# Patient Record
Sex: Male | Born: 1960 | Race: White | Hispanic: No | Marital: Married | Smoking: Former smoker
Health system: Southern US, Community
[De-identification: ages and names within clinical notes are randomized; demographics above are authoritative.]

## PROBLEM LIST (undated history)

## (undated) DIAGNOSIS — T7840XA Allergy, unspecified, initial encounter: Secondary | ICD-10-CM

## (undated) DIAGNOSIS — Z789 Other specified health status: Secondary | ICD-10-CM

## (undated) HISTORY — DX: Allergy, unspecified, initial encounter: T78.40XA

---

## 2012-03-12 ENCOUNTER — Encounter (HOSPITAL_COMMUNITY): Payer: Self-pay | Admitting: *Deleted

## 2012-03-12 ENCOUNTER — Emergency Department (HOSPITAL_COMMUNITY)
Admission: EM | Admit: 2012-03-12 | Discharge: 2012-03-12 | Disposition: A | Discharge status: Home / Self Care | Payer: BC Managed Care – PPO | Attending: Emergency Medicine | Admitting: Emergency Medicine

## 2012-03-12 DIAGNOSIS — B349 Viral infection, unspecified: Secondary | ICD-10-CM

## 2012-03-12 DIAGNOSIS — Z87891 Personal history of nicotine dependence: Secondary | ICD-10-CM | POA: Insufficient documentation

## 2012-03-12 DIAGNOSIS — J3489 Other specified disorders of nose and nasal sinuses: Secondary | ICD-10-CM | POA: Insufficient documentation

## 2012-03-12 MED ORDER — IPRATROPIUM BROMIDE 0.03 % NA SOLN: 2.0000 | Freq: Two times a day (BID) | NASAL | Status: AC

## 2012-03-12 NOTE — ED Provider Notes (Signed)
History     CSN: [id number]  Arrival date & time 03/12/12  1531   First MD Initiated Contact with Patient 03/12/12 1853      No chief complaint on file.   (Consider location/radiation/quality/duration/timing/severity/associated sxs/prior treatment) The history is provided by the patient.   patient here with nasal congestion and URI symptoms x4 days. No fever, vomiting, diarrhea. No cough or congestion. Some sore throat. Denies any ear pain. Has been using over-the-counter medications without relief. Denies any recent sick exposures. No urinary symptoms  History reviewed. No pertinent past medical history.  History reviewed. No pertinent past surgical history.  No family history on file.  History  Substance Use Topics  . Smoking status: Former Games developer  . Smokeless tobacco: Not on file  . Alcohol Use: Yes     occ      Review of Systems  All other systems reviewed and are negative.    Allergies  Review of patient's allergies indicates no known allergies.  Home Medications   Current Outpatient Rx  Name Route Sig Dispense Refill  . IBUPROFEN 200 MG PO TABS Oral Take 400 mg by mouth every 6 (six) hours as needed. For pain    . ADULT MULTIVITAMIN W/MINERALS CH Oral Take 1 tablet by mouth daily.      BP 113/72  Pulse 90  Temp 98.7 F (37.1 C)  Resp 20  SpO2 98%  Physical Exam  Nursing note and vitals reviewed. Constitutional: He is oriented to person, place, and time. He appears well-developed and well-nourished.  Non-toxic appearance.  HENT:  Head: Normocephalic and atraumatic.  Eyes: Conjunctivae are normal. Pupils are equal, round, and reactive to light.  Neck: Normal range of motion.  Cardiovascular: Normal rate.   Pulmonary/Chest: Effort normal.  Neurological: He is alert and oriented to person, place, and time.  Skin: Skin is warm and dry.  Psychiatric: His mood appears anxious.    ED Course  Procedures (including critical care time)  Labs Reviewed  - No data to display No results found.   No diagnosis found.    MDM  Suspect that patient has viral illness. Will place patient on Atrovent nasal drops and will give PCP referral        Toy Baker, MD 03/12/12 [phone]

## 2012-03-12 NOTE — Discharge Instructions (Signed)
Antibiotic Nonuse  Your caregiver felt that the infection or problem was not one that would be helped with an antibiotic. Infections may be caused by viruses or bacteria. Only a caregiver can tell which one of these is the likely cause of an illness. A cold is the most common cause of infection in both adults and children. A cold is a virus. Antibiotic treatment will have no effect on a viral infection. Viruses can lead to many lost days of work caring for sick children and many missed days of school. Children may catch as many as 10 "colds" or "flus" per year during which they can be tearful, cranky, and uncomfortable. The goal of treating a virus is aimed at keeping the ill person comfortable. Antibiotics are medications used to help the body fight bacterial infections. There are relatively few types of bacteria that cause infections but there are hundreds of viruses. While both viruses and bacteria cause infection they are very different types of germs. A viral infection will typically go away by itself within 7 to 10 days. Bacterial infections may spread or get worse without antibiotic treatment. Examples of bacterial infections are:  Sore throats (like strep throat or tonsillitis).   Infection in the lung (pneumonia).   Ear and skin infections.  Examples of viral infections are:  Colds or flus.   Most coughs and bronchitis.   Sore throats not caused by Strep.   Runny noses.  It is often best not to take an antibiotic when a viral infection is the cause of the problem. Antibiotics can kill off the helpful bacteria that we have inside our body and allow harmful bacteria to start growing. Antibiotics can cause side effects such as allergies, nausea, and diarrhea without helping to improve the symptoms of the viral infection. Additionally, repeated uses of antibiotics can cause bacteria inside of our body to become resistant. That resistance can be passed onto harmful bacterial. The next time  you have an infection it may be harder to treat if antibiotics are used when they are not needed. Not treating with antibiotics allows our own immune system to develop and take care of infections more efficiently. Also, antibiotics will work better for us when they are prescribed for bacterial infections. Treatments for a child that is ill may include:  Give extra fluids throughout the day to stay hydrated.   Get plenty of rest.   Only give your child over-the-counter or prescription medicines for pain, discomfort, or fever as directed by your caregiver.   The use of a cool mist humidifier may help stuffy noses.   Cold medications if suggested by your caregiver.  Your caregiver may decide to start you on an antibiotic if:  The problem you were seen for today continues for a longer length of time than expected.   You develop a secondary bacterial infection.  SEEK MEDICAL CARE IF:  Fever lasts longer than 5 days.   Symptoms continue to get worse after 5 to 7 days or become severe.   Difficulty in breathing develops.   Signs of dehydration develop (poor drinking, rare urinating, dark colored urine).   Changes in behavior or worsening tiredness (listlessness or lethargy).  Document Released: 02/11/2002 Document Revised: 11/22/2011 Document Reviewed: 08/10/2009 ExitCare Patient Information 2012 [employer].Viral Infections A viral infection can be caused by different types of viruses.Most viral infections are not serious and resolve on their own. However, some infections may cause severe symptoms and may lead to further complications. SYMPTOMS Viruses   can frequently cause:  Minor sore throat.   Aches and pains.   Headaches.   Runny nose.   Different types of rashes.   Watery eyes.   Tiredness.   Cough.   Loss of appetite.   Gastrointestinal infections, resulting in nausea, vomiting, and diarrhea.  These symptoms do not respond to antibiotics because the infection  is not caused by bacteria. However, you might catch a bacterial infection following the viral infection. This is sometimes called a "superinfection." Symptoms of such a bacterial infection may include:  Worsening sore throat with pus and difficulty swallowing.   Swollen neck glands.   Chills and a high or persistent fever.   Severe headache.   Tenderness over the sinuses.   Persistent overall ill feeling (malaise), muscle aches, and tiredness (fatigue).   Persistent cough.   Yellow, green, or brown mucus production with coughing.  HOME CARE INSTRUCTIONS   Only take over-the-counter or prescription medicines for pain, discomfort, diarrhea, or fever as directed by your caregiver.   Drink enough water and fluids to keep your urine clear or pale yellow. Sports drinks can provide valuable electrolytes, sugars, and hydration.   Get plenty of rest and maintain proper nutrition. Soups and broths with crackers or rice are fine.  SEEK IMMEDIATE MEDICAL CARE IF:   You have severe headaches, shortness of breath, chest pain, neck pain, or an unusual rash.   You have uncontrolled vomiting, diarrhea, or you are unable to keep down fluids.   You or your child has an oral temperature above 102 F (38.9 C), not controlled by medicine.   Your baby is older than 3 months with a rectal temperature of 102 F (38.9 C) or higher.   Your baby is 3 months old or younger with a rectal temperature of 100.4 F (38 C) or higher.  MAKE SURE YOU:   Understand these instructions.   Will watch your condition.   Will get help right away if you are not doing well or get worse.  Document Released: 09/12/2005 Document Revised: 11/22/2011 Document Reviewed: 04/09/2011 ExitCare Patient Information 2012 [employer]. 

## 2012-03-12 NOTE — ED Notes (Signed)
Pt is here with symptoms of nasal congestion and headaches on Sunday.  Pt sts he has been taking otc medications.  Pt is having hot and cold symptoms at the same time.  Sore throat.  No vomiting or diarrhea

## 2012-03-12 NOTE — ED Notes (Signed)
Pt was called to come to str. Triage 4 with no answer.

## 2013-04-07 ENCOUNTER — Ambulatory Visit (INDEPENDENT_AMBULATORY_CARE_PROVIDER_SITE_OTHER): Payer: PRIVATE HEALTH INSURANCE | Admitting: Family Medicine

## 2013-04-07 VITALS — BP 119/73 | HR 78 | Temp 98.5°F | Resp 16 | Ht 73.0 in | Wt 168.0 lb

## 2013-04-07 DIAGNOSIS — M79609 Pain in unspecified limb: Secondary | ICD-10-CM

## 2013-04-07 DIAGNOSIS — L02419 Cutaneous abscess of limb, unspecified: Secondary | ICD-10-CM

## 2013-04-07 MED ORDER — DOXYCYCLINE HYCLATE 100 MG PO CAPS: 100.0000 mg | ORAL_CAPSULE | Freq: Two times a day (BID) | ORAL | Status: DC

## 2013-04-07 NOTE — Progress Notes (Signed)
  Subjective:    Patient ID: Ivan Blackwell, male    DOB: 1961-07-20, 52 y.o.   MRN: [id number] Chief Complaint  Patient presents with  . Insect Bite    right leg x 2 day    HPI  2d noted a little pimple and over the past 2d progressively red and big.  Not draining anything. No insect seen. Nothing like this prior.  History reviewed. No pertinent past medical history. Current Outpatient Prescriptions on File Prior to Visit  Medication Sig Dispense Refill  . ibuprofen (ADVIL,MOTRIN) 200 MG tablet Take 400 mg by mouth every 6 (six) hours as needed. For pain      . Multiple Vitamin (MULITIVITAMIN WITH MINERALS) TABS Take 1 tablet by mouth daily.       No current facility-administered medications on file prior to visit.   No Known Allergies  Review of Systems  Constitutional: Negative for fever, chills, diaphoresis, activity change, appetite change, fatigue and unexpected weight change.  Cardiovascular: Negative for leg swelling.  Gastrointestinal: Negative for nausea, vomiting, abdominal pain, diarrhea and constipation.  Musculoskeletal: Positive for myalgias. Negative for back pain, joint swelling, arthralgias and gait problem.  Skin: Positive for rash and wound.  Neurological: Negative for weakness and numbness.  Hematological: Negative for adenopathy. Does not bruise/bleed easily.      BP 119/73  Pulse 78  Temp(Src) 98.5 F (36.9 C) (Oral)  Resp 16  Ht 6\' 1"  (1.854 m)  Wt 168 lb (76.204 kg)  BMI 22.17 kg/m2  SpO2 98% Objective:   Physical Exam  Constitutional: He is oriented to person, place, and time. He appears well-developed and well-nourished. No distress.  HENT:  Head: Normocephalic and atraumatic.  Eyes: No scleral icterus.  Pulmonary/Chest: Effort normal.  Musculoskeletal:       Right hip: Normal. He exhibits normal range of motion and normal strength.       Left hip: Normal. He exhibits normal range of motion and normal strength.  Neurological: He is alert and  oriented to person, place, and time.  Skin: Skin is warm and dry. Rash noted. Rash is maculopapular. He is not diaphoretic.  Left hip, immed below lateral greater trochanter is approx 5cm dm tender erythema, mild warmth with central pinpoint eschar drains small of purulent discharge with palpation.  Small 2cm area of central fluctuance palpable beneath center.  Psychiatric: He has a normal mood and affect. His behavior is normal.      Assessment & Plan:  Cellulitis and abscess of leg, except foot - Plan: Wound culture - s/p I&D w/ packing by Debbra Riding in clinic. Start doxy. F/u in 2d for recheck.  Meds ordered this encounter  Medications  . doxycycline (VIBRAMYCIN) 100 MG capsule    Sig: Take 1 capsule (100 mg total) by mouth 2 (two) times daily.    Dispense:  20 capsule    Refill:  0

## 2013-04-07 NOTE — Progress Notes (Signed)
Procedure Note: Verbal consent obtained.  Local anesthesia with 3 cc 2% lidocaine.  Betadine prep.  Incision with 11 blade.  Very small amount of purulence expressed.  Wound irrigated with remaining anesthetic.  Packed with 1/4 inch plain packing.  Cleansed and dressed.  Discussed wound care.

## 2013-04-10 LAB — WOUND CULTURE: Gram Stain: NONE SEEN

## 2013-06-10 ENCOUNTER — Ambulatory Visit (INDEPENDENT_AMBULATORY_CARE_PROVIDER_SITE_OTHER): Payer: PRIVATE HEALTH INSURANCE | Admitting: Emergency Medicine

## 2013-06-10 ENCOUNTER — Ambulatory Visit: Payer: PRIVATE HEALTH INSURANCE

## 2013-06-10 VITALS — BP 117/87 | HR 67 | Temp 98.0°F | Resp 17 | Ht 74.0 in | Wt 168.0 lb

## 2013-06-10 DIAGNOSIS — M549 Dorsalgia, unspecified: Secondary | ICD-10-CM

## 2013-06-10 DIAGNOSIS — R51 Headache: Secondary | ICD-10-CM

## 2013-06-10 MED ORDER — CYCLOBENZAPRINE HCL 5 MG PO TABS: ORAL_TABLET | ORAL | Status: DC

## 2013-06-10 MED ORDER — MELOXICAM 7.5 MG PO TABS: 7.5000 mg | ORAL_TABLET | Freq: Every day | ORAL | Status: DC

## 2013-06-10 NOTE — Patient Instructions (Addendum)
Please call if you continue to have problems and I will make a referral either to physical therapy or chiropractic careCervical Sprain A cervical sprain is when the ligaments in the neck stretch or tear. The ligaments are the tissues that hold the neck bones in place. HOME CARE   Put ice on the injured area.  Put ice in a plastic bag.  Place a towel between your skin and the bag.  Leave the ice on for 15-20 minutes, 3-4 times a day.  Only take medicine as told by your doctor.  Keep all doctor visits as told.  Keep all physical therapy visits as told.  If your doctor gives you a neck collar, wear it as told.  Do not drive while wearing a neck collar.  Adjust your work station so that you have good posture while you work.  Avoid positions and activities that make your problems worse.  Warm up and stretch before being active. GET HELP RIGHT AWAY IF:   You are bleeding or your stomach is upset.  You have an allergic reaction to your medicine.  Your problems (symptoms) get worse.  You develop new problems.  You lose feeling (numbness) or you cannot move (paralysis) any part of your body.  You have tingling or weakness in any part of your body.  Your pain is not controlled with medicine.  You cannot take less pain medicine over time as planned.  Your activity level does not improve as expected. MAKE SURE YOU:   Understand these instructions.  Will watch your condition.  Will get help right away if you are not doing well or get worse. Document Released: 05/21/2008 Document Revised: 02/25/2012 Document Reviewed: 09/06/2011 [facility] Patient Information 2014 [place], [place].

## 2013-06-10 NOTE — Progress Notes (Signed)
  Subjective:    Patient ID: Ivan Blackwell, male    DOB: [birth year], 52 y.o.   MRN: [id number]  HPI Pt complains of pain/spasm in his right upper back starting 3-4 days ago. Says he feels like it feels like it comes down his arm. He has no history of neck injury. 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 He has tried some pain relievers with no relief. SOmetimes he wakes up in the middle of the night and feels it throbbing.     Review of Systems     Objective:   Physical Exam there is tenderness along the right side and C-spine. There is tenderness along the right scapula and in the triceps area. Deep tendon reflexes are 2+ and symmetrical. The patient sits with his head tipped to the left. He does have pain when he extends his neck. [facility] reading (PRIMARY) by  Dr.Janay Canan there is C5-6 C6-7 degenerative disc disease          Assessment & Plan:  Patient has evidence of C5-6 C6-7 degenerative disc disease. I have recommended chiropractic care for his problem. I think he would do well with cervical traction and chiropractic modalities. Patient would prefer to try medication first. We'll try Mobic and Flexeril. If he continues to have problems then we'll make referral to either chiropractic care or physical therapy.

## 2013-07-07 ENCOUNTER — Ambulatory Visit: Payer: PRIVATE HEALTH INSURANCE

## 2013-07-07 ENCOUNTER — Ambulatory Visit (INDEPENDENT_AMBULATORY_CARE_PROVIDER_SITE_OTHER): Payer: PRIVATE HEALTH INSURANCE | Admitting: Family Medicine

## 2013-07-07 VITALS — BP 124/80 | HR 58 | Temp 97.9°F | Resp 16 | Ht 74.0 in | Wt 170.8 lb

## 2013-07-07 DIAGNOSIS — M25511 Pain in right shoulder: Secondary | ICD-10-CM

## 2013-07-07 DIAGNOSIS — M25519 Pain in unspecified shoulder: Secondary | ICD-10-CM

## 2013-07-07 MED ORDER — TRAMADOL HCL 50 MG PO TABS: 50.0000 mg | ORAL_TABLET | Freq: Three times a day (TID) | ORAL | Status: DC | PRN

## 2013-07-07 NOTE — Progress Notes (Signed)
[facility] [address], [place] [address] [phone]  Date:  07/07/2013   Name:  Ivan Blackwell   DOB:  1961/06/25   MRN:  [id number]  PCP:  Pcp Not In System    Chief Complaint: Shoulder Pain and Neck Pain   History of Present Illness:  Ivan Blackwell is a 52 y.o. very pleasant male patient who presents with the following:  Here last month with pain in his right upper back, dx with cervical degenerative changes.  tx with mobic and flexeril.    Films from 06/10/13: CERVICAL SPINE - 2-3 VIEW  Comparison: None.  Findings: Two-view exam shows no fracture. No subluxation. Loss  of disc height is seen at C5-6 and C6-7. The no prevertebral soft  tissue swelling. Straightening of normal cervical lordosis is  evident.  IMPRESSION:  Loss of disc height at C5-6 and C6-7 consistent with degenerative  change.  Clinically significant discrepancy from primary report, if  provided: None  RIGHT SHOULDER - 2+ VIEW  Comparison: None.  Findings: Three-view study shows no fracture. No subluxation or  dislocation. No worrisome lytic or sclerotic osseous abnormality.  IMPRESSION:  No findings to explain the patient's history of pain.  He states that his neck pain seemed to resolve, but then came back.  He now feels as though his right shoulder ROM has decreased and he is having trouble with dressing/ ADLs.   This morning he notes "sharp pain all up and down my right shoulder and down into my right arm."  There was no acute injury that he can recall.  He notes that the flexeril and mobic "are very slow working medications.  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  Estimates that it weighs about 35 lbs.    There are no active problems to display for this patient.   History reviewed. No pertinent past medical history.  History reviewed. No pertinent past surgical history.  History   Substance Use Topics  . Smoking status: Former Games developer  . Smokeless tobacco: Not on file  . Alcohol Use: Yes     Comment: occ    History reviewed. No pertinent family history.  No Known Allergies  Medication list has been reviewed and updated.  Current Outpatient Prescriptions on File Prior to Visit  Medication Sig Dispense Refill  . cyclobenzaprine (FLEXERIL) 5 MG tablet Take one tablet at night as a muscle relaxant  30 tablet  0  . meloxicam (MOBIC) 7.5 MG tablet Take 1 tablet (7.5 mg total) by mouth daily.  14 tablet  0   No current facility-administered medications on file prior to visit.    Review of Systems:  As per HPI- otherwise negative.   Physical Examination: Filed Vitals:   07/07/13 0752  BP: 124/80  Pulse: 58  Temp: 97.9 F (36.6 C)  Resp: 16   Filed Vitals:   07/07/13 0752  Height: 6\' 2"  (1.88 m)  Weight: 170 lb 12.8 oz (77.474 kg)   Body mass index is 21.92 kg/(m^2). Ideal Body Weight: Weight in (lb) to have BMI = 25: 194.3  GEN: WDWN, NAD, Non-toxic, Alert but unusual affect.  Avoids eye contact and seems quite irrititable. He is carrying a large knife on his belt HEENT: Atraumatic, Normocephalic. Neck supple. No masses, No LAD. Ears and Nose: No external deformity. CV: RRR, No M/G/R. No JVD.  No thrill. No extra heart sounds. PULM: CTA B, no wheezes, crackles, rhonchi. No retractions. No resp. distress. No accessory muscle use. EXTR: No c/c/e NEURO Normal gait.  PSYCH: Normally interactive. Conversant. Not depressed or anxious appearing.  Calm demeanor.  Right shoulder: tender over the right anterior rotator cuff tendon insertion.  Normal ROM, no evidence of any dislocation, cellulitis or skin lesion.  Negative empty can test.    [facility] reading (PRIMARY) by  Dr. Patsy Lager. Right shoulder: normal RIGHT SHOULDER - 2+ VIEW  Comparison: 06/10/2013.  Findings: Alignment is normal. Joint spaces are preserved. No fracture or dislocation is evident. No  soft tissue lesions are seen. No calcific bursitis or calcific tendonitis is evident. No cervical rib is evident.  IMPRESSION: No shoulder abnormality is identified.  Clinically significant discrepancy from primary report, if provided: None  Assessment and Plan: Pain in joint, shoulder region, right - Plan: DG Shoulder Right, traMADol (ULTRAM) 50 MG tablet  Shoulder pain.  Suspect her has rotator cuff tendonitis.  He may try tramadol for pain.  He wants to know precisely why his shoulder hurts.  Advised him that an MRI is necessary to visualize the soft tissue of his shoulder.  He does not want an MRI at this time, but will let me know if not better and I can refer him to ortho or order an MRI if he would like.    Signed Abbe Amsterdam, MD

## 2013-07-07 NOTE — Patient Instructions (Addendum)
We are going to try using tramadol for the pain in your shoulder.  If you are not better in the next few days please let me know.  In that case I can send you to a shoulder specialist an injection may be helpful. In the meantime try to keep up your shoulder range of motion by doing gentle stretches.

## 2013-07-15 ENCOUNTER — Telehealth: Payer: Self-pay

## 2013-07-15 NOTE — Telephone Encounter (Signed)
Pt called and spoke with [facility]. He reported that he was here on 6/25 to see Dr Cleta Alberts re: cervical sprain and took the flexeril and mobic Rxd, but Sxs worsened. Pt RTC and saw Dr Patsy Lager who recommended ortho or MRI since pain had moved into the shoulder/arm. Also Rxd Ultram. Pain is not longer in neck now, but is still in R shoulder/arm and back. Pt reports that he also has a new Sx that he only wants to talk with a male MD about. Pt has also tried ice/hot soaks and icy hot. Pt advised Jill Side that he had refused the chiro, ortho and MRI d/t cost.  LMOM for pt to CB.

## 2013-07-15 NOTE — Telephone Encounter (Signed)
I spoke w/Dr Cleta Alberts who reviewed pt's chart and xray. Dr Cleta Alberts called pt d/t pt's req to speak to a male MD only about new Sx. Dr Cleta Alberts had to [facility] advising pt what xray showed and that next step if no improvement w/medication would be to either refer to specialist or order MRI. Asked for CB.

## 2013-07-16 ENCOUNTER — Telehealth: Payer: Self-pay

## 2013-07-16 ENCOUNTER — Ambulatory Visit (INDEPENDENT_AMBULATORY_CARE_PROVIDER_SITE_OTHER): Payer: PRIVATE HEALTH INSURANCE | Admitting: Emergency Medicine

## 2013-07-16 VITALS — BP 130/94 | HR 74 | Temp 97.5°F | Resp 18 | Ht 74.0 in | Wt 163.0 lb

## 2013-07-16 DIAGNOSIS — M542 Cervicalgia: Secondary | ICD-10-CM

## 2013-07-16 DIAGNOSIS — R202 Paresthesia of skin: Secondary | ICD-10-CM

## 2013-07-16 DIAGNOSIS — R937 Abnormal findings on diagnostic imaging of other parts of musculoskeletal system: Secondary | ICD-10-CM

## 2013-07-16 DIAGNOSIS — R200 Anesthesia of skin: Secondary | ICD-10-CM

## 2013-07-16 DIAGNOSIS — IMO0002 Reserved for concepts with insufficient information to code with codable children: Secondary | ICD-10-CM

## 2013-07-16 DIAGNOSIS — R209 Unspecified disturbances of skin sensation: Secondary | ICD-10-CM

## 2013-07-16 MED ORDER — OXYCODONE-ACETAMINOPHEN 5-325 MG PO TABS: 1.0000 | ORAL_TABLET | Freq: Three times a day (TID) | ORAL | Status: DC | PRN

## 2013-07-16 MED ORDER — PREDNISONE 20 MG PO TABS: ORAL_TABLET | ORAL | Status: DC

## 2013-07-16 NOTE — Telephone Encounter (Signed)
Pt called very upset about his recent visits and treatment plan. States he feels that his problem shouldn't be as bad as it is still. Talked to pt for almost 20 minutes. Pt upset about his back pain and requested copy of the xrays of his back/head/spine so he can show people at work so they understand his pain. Pt has DDD. Pt felt that he has come to see [place] so often recently and paid more in copays than he felt necessary we should provide this service for him. I stated we were more than happy to do that for him.  Pt continued to speak about his anxiety over possible back surgery and his fears about that. Pt has an MRI tomorrow morning. Pt asked for my advice on the possibility of surgery and I assured him that the dr would make the best decisions regarding his medical care and if need be refer pt to specialist based on those results and that its a case by case basis.   I gave Corrie Dandy B the pt's information to burn xrays to cd.   Pt seemed like he just needed to talk for a while. Pt seemed more reassured when the call ended. Will pick up xray cd this evening.   bf

## 2013-07-16 NOTE — Patient Instructions (Addendum)
Report to [facility] at [facility] at 8 am tomorrow morning for your MRI.

## 2013-07-16 NOTE — Progress Notes (Signed)
  Subjective:    Patient ID: Ivan Blackwell, male    DOB: [birth year], 52 y.o.   MRN: [id number]  HPI patient has persistent pain in his neck he was found to have cervical disc disease at C5-6 and C6-7. He has excruciating pain in his shoulder and down his right arm. He does not feel weak in that arm. He just is unable to get in a comfortable position. He has had difficulty getting erections but has had no symptoms of bowel or bladder incontinence    Review of Systems     Objective:   Physical Exam patient pacing in the room appears uncomfortable regarding his neck pain. Deep tendon reflexes of the upper extremities are 2+. Motor strength was symmetrical.        Assessment & Plan:  Plan on emergent MRI tomorrow. We'll place patent stent on prednisone in a taper dose Percocet for pain.

## 2013-07-16 NOTE — Telephone Encounter (Signed)
Pt was seen today in office. 

## 2013-07-17 ENCOUNTER — Ambulatory Visit
Admission: RE | Admit: 2013-07-17 | Discharge: 2013-07-17 | Disposition: A | Discharge status: Home / Self Care | Payer: PRIVATE HEALTH INSURANCE | Source: Ambulatory Visit | Attending: Emergency Medicine | Admitting: Emergency Medicine

## 2013-07-17 ENCOUNTER — Other Ambulatory Visit: Payer: Self-pay | Admitting: Emergency Medicine

## 2013-07-17 ENCOUNTER — Other Ambulatory Visit: Payer: BC Managed Care – PPO

## 2013-07-17 DIAGNOSIS — M5010 Cervical disc disorder with radiculopathy, unspecified cervical region: Secondary | ICD-10-CM

## 2013-07-17 DIAGNOSIS — R202 Paresthesia of skin: Secondary | ICD-10-CM

## 2013-07-17 DIAGNOSIS — R937 Abnormal findings on diagnostic imaging of other parts of musculoskeletal system: Secondary | ICD-10-CM

## 2013-07-17 DIAGNOSIS — R200 Anesthesia of skin: Secondary | ICD-10-CM

## 2013-07-17 DIAGNOSIS — M542 Cervicalgia: Secondary | ICD-10-CM

## 2013-07-17 DIAGNOSIS — IMO0002 Reserved for concepts with insufficient information to code with codable children: Secondary | ICD-10-CM

## 2013-07-17 NOTE — Telephone Encounter (Signed)
Thanks so much, will call when MRI results in.

## 2013-07-18 ENCOUNTER — Telehealth: Payer: Self-pay

## 2013-07-18 ENCOUNTER — Other Ambulatory Visit: Payer: Self-pay | Admitting: Emergency Medicine

## 2013-07-18 DIAGNOSIS — IMO0002 Reserved for concepts with insufficient information to code with codable children: Secondary | ICD-10-CM

## 2013-07-18 MED ORDER — OXYCODONE-ACETAMINOPHEN 5-325 MG PO TABS: 1.0000 | ORAL_TABLET | ORAL | Status: DC | PRN

## 2013-07-18 NOTE — Telephone Encounter (Signed)
Patient would like someone to call him about questions with his pain medication please call him at [phone]

## 2013-07-18 NOTE — Telephone Encounter (Signed)
Patient came in office this am. He was put on oxycodone 5/325 q 8 hrs prn. He is not getting relief from pain in the am. The pain is worst in the am. He has tried taking with little food lot of food to see if that helped medication absorb better or get any more relief but he is not getting relief with first dose in the am. Since it does not resolved pain in am he feels the need to take before 8 hrs. He has not done this but he is wanting to know what can he do to get pain relief in am. He also is afraid that once he runs out of medication that's it and he will be left out in the cold until you figure out next plan or waiting for next plan. Please advise

## 2013-07-18 NOTE — Telephone Encounter (Signed)
Pt calling very upset/rude because we have not prescribed him medication that have worked, he states that we have changed his medications 3 times and have not helped him relieve pain. Patient wants answers as to why we are unable to help him with his pain. Best# [phone]

## 2013-07-18 NOTE — Telephone Encounter (Signed)
I called and discussed the situation with the patient. He is going to take his medication every 4 hours. We are calling on Monday to see when we can get him into neurosurgery.

## 2013-07-20 ENCOUNTER — Other Ambulatory Visit: Payer: BC Managed Care – PPO

## 2013-07-21 ENCOUNTER — Other Ambulatory Visit: Payer: BC Managed Care – PPO

## 2013-07-21 ENCOUNTER — Telehealth: Payer: Self-pay

## 2013-07-21 NOTE — Telephone Encounter (Signed)
Patient would like for a nurse to call him regarding his medications and dosages please call him at [phone]

## 2013-07-22 NOTE — Telephone Encounter (Signed)
  Disp Refills Start End predniSONE (DELTASONE) 20 MG tablet 18 tablet 0 07/16/2013 Take 3 a day for 3 days 2 a day for 3 days one a day for 3 days oxyCODONE-acetaminophen (ROXICET) 5-325 MG per tablet (Discontinued) 20 tablet

## 2013-07-22 NOTE — Telephone Encounter (Signed)
Called patient. He has taken Oxycodone q4hrs since q8hrs did not help him. He vomited from this. He indicates he was unsure if he should take q 8hrs or q 4hrs advised to take this q 8hrs, only as needed. Patient is going to have surgery for his neck because of his triceps weakness. Dr Franky Macho will do the surgery. To you FYI

## 2013-07-23 ENCOUNTER — Other Ambulatory Visit: Payer: Self-pay | Admitting: Neurosurgery

## 2013-07-24 ENCOUNTER — Encounter (HOSPITAL_COMMUNITY): Payer: Self-pay

## 2013-07-24 ENCOUNTER — Encounter (HOSPITAL_COMMUNITY)
Admission: RE | Admit: 2013-07-24 | Discharge: 2013-07-24 | Disposition: A | Discharge status: Home / Self Care | Payer: PRIVATE HEALTH INSURANCE | Source: Ambulatory Visit | Attending: Neurosurgery | Admitting: Neurosurgery

## 2013-07-24 DIAGNOSIS — Z01818 Encounter for other preprocedural examination: Secondary | ICD-10-CM | POA: Insufficient documentation

## 2013-07-24 DIAGNOSIS — Z01812 Encounter for preprocedural laboratory examination: Secondary | ICD-10-CM | POA: Insufficient documentation

## 2013-07-24 HISTORY — DX: Other specified health status: Z78.9

## 2013-07-24 LAB — SURGICAL PCR SCREEN: Staphylococcus aureus: NEGATIVE

## 2013-07-24 LAB — CBC
HCT: 38.3 % — ABNORMAL LOW (ref 39.0–52.0)
Hemoglobin: 13.8 g/dL (ref 13.0–17.0)
MCH: 31 pg (ref 26.0–34.0)
MCHC: 36 g/dL (ref 30.0–36.0)
MCV: 86.1 fL (ref 78.0–100.0)
RDW: 12.4 % (ref 11.5–15.5)

## 2013-07-24 NOTE — Pre-Procedure Instructions (Signed)
Ivan Blackwell  07/24/2013   Your procedure is scheduled on:  Wednmesday, August 13th.  Report to [facility] at 8:00AM.  Call this number if you have problems the morning of surgery: [phone]   Remember:   Do not eat food or drink liquids after midnight.   Take these medicines the morning of surgery with A SIP OF WATER: oxyCODONE-acetaminophen (ROXICET) or traMADol (ULTRAM).  Stop taking Aspirin, Coumadin, Plavix, Effient and Herbal medications.  Do not take any NSAIDs WU:[id number] (MOBIC),  Ibuprofen,  Advil,Naproxen or any medication containing Aspirin.    Do not wear jewelry, make-up or nail polish.  Do not wear lotions, powders, or perfumes. You may wear deodorant.   Men may shave face and neck.  Do not bring valuables to the hospital.  [facility] is not responsible  for any belongings or valuables.  Contacts, dentures or bridgework may not be worn into surgery.  Leave suitcase in the car. After surgery it may be brought to your room.  For patients admitted to the hospital, checkout time is 11:00 AM the day of discharge.   Patients discharged the day of surgery will not be allowed to drive home.  Name and phone number of your driver: -   Special Instructions: Shower using CHG 2 nights before surgery and the night before surgery.  If you shower the day of surgery use CHG.  Use special wash - you have one bottle of CHG for all showers.  You should use approximately 1/3 of the bottle for each shower.   Please read over the following fact sheets that you were given: Pain Booklet, Coughing and Deep Breathing and Surgical Site Infection Prevention

## 2013-07-24 NOTE — Progress Notes (Signed)
Pt informed that if he goes home that someone must be with him for the first 24 hours.Pt said he would have his [place] to  check on him.  I repeated again that he must have someone to stay with him.  Pt said that he can always do what he chooses to do.

## 2013-07-28 MED ORDER — CEFAZOLIN SODIUM-DEXTROSE 2-3 GM-% IV SOLR
2.0000 g | INTRAVENOUS | Status: AC
  Administered 2013-07-29: 2 g via INTRAVENOUS
  Filled 2013-07-28: qty 50

## 2013-07-29 ENCOUNTER — Ambulatory Visit (HOSPITAL_COMMUNITY)
Admission: RE | Admit: 2013-07-29 | Discharge: 2013-07-30 | Disposition: A | Discharge status: Home / Self Care | Payer: PRIVATE HEALTH INSURANCE | Source: Ambulatory Visit | Attending: Neurosurgery | Admitting: Neurosurgery

## 2013-07-29 ENCOUNTER — Ambulatory Visit (HOSPITAL_COMMUNITY): Payer: PRIVATE HEALTH INSURANCE

## 2013-07-29 ENCOUNTER — Encounter (HOSPITAL_COMMUNITY): Payer: Self-pay | Admitting: *Deleted

## 2013-07-29 ENCOUNTER — Encounter (HOSPITAL_COMMUNITY): Payer: Self-pay | Admitting: Certified Registered Nurse Anesthetist

## 2013-07-29 ENCOUNTER — Ambulatory Visit (HOSPITAL_COMMUNITY): Payer: PRIVATE HEALTH INSURANCE | Admitting: Certified Registered Nurse Anesthetist

## 2013-07-29 ENCOUNTER — Encounter (HOSPITAL_COMMUNITY)
Admission: RE | Disposition: A | Discharge status: Home / Self Care | Payer: Self-pay | Source: Ambulatory Visit | Attending: Neurosurgery

## 2013-07-29 DIAGNOSIS — M5020 Other cervical disc displacement, unspecified cervical region: Secondary | ICD-10-CM | POA: Insufficient documentation

## 2013-07-29 DIAGNOSIS — M47812 Spondylosis without myelopathy or radiculopathy, cervical region: Secondary | ICD-10-CM | POA: Insufficient documentation

## 2013-07-29 HISTORY — PX: ANTERIOR CERVICAL DECOMP/DISCECTOMY FUSION: SHX1161

## 2013-07-29 SURGERY — ANTERIOR CERVICAL DECOMPRESSION/DISCECTOMY FUSION 1 LEVEL
Anesthesia: General | Wound class: Clean

## 2013-07-29 MED ORDER — LIDOCAINE HCL 4 % MT SOLN
OROMUCOSAL | Status: DC | PRN
  Administered 2013-07-29: 80 mL via TOPICAL
  Administered 2013-07-29: 4 mL via TOPICAL

## 2013-07-29 MED ORDER — HYDROMORPHONE HCL PF 1 MG/ML IJ SOLN
INTRAMUSCULAR | Status: DC | PRN
  Administered 2013-07-29: 1 mg via INTRAVENOUS

## 2013-07-29 MED ORDER — PROMETHAZINE HCL 25 MG/ML IJ SOLN: 6.2500 mg | INTRAMUSCULAR | Status: DC | PRN

## 2013-07-29 MED ORDER — ROCURONIUM BROMIDE 100 MG/10ML IV SOLN
INTRAVENOUS | Status: DC | PRN
  Administered 2013-07-29: 50 mg via INTRAVENOUS

## 2013-07-29 MED ORDER — POLYETHYLENE GLYCOL 3350 17 G PO PACK
17.0000 g | PACK | Freq: Every day | ORAL | Status: DC | PRN
  Filled 2013-07-29: qty 1

## 2013-07-29 MED ORDER — LACTATED RINGERS IV SOLN
INTRAVENOUS | Status: DC
  Administered 2013-07-29 (×2): via INTRAVENOUS

## 2013-07-29 MED ORDER — PHENOL 1.4 % MT LIQD: 1.0000 | OROMUCOSAL | Status: DC | PRN

## 2013-07-29 MED ORDER — NEOSTIGMINE METHYLSULFATE 1 MG/ML IJ SOLN
INTRAMUSCULAR | Status: DC | PRN
  Administered 2013-07-29: 4 mg via INTRAVENOUS

## 2013-07-29 MED ORDER — OXYCODONE HCL 5 MG PO TABS
ORAL_TABLET | ORAL | Status: AC
  Administered 2013-07-29: 5 mg
  Filled 2013-07-29: qty 1

## 2013-07-29 MED ORDER — FENTANYL CITRATE 0.05 MG/ML IJ SOLN
INTRAMUSCULAR | Status: DC | PRN
  Administered 2013-07-29: 50 ug via INTRAVENOUS
  Administered 2013-07-29: 150 ug via INTRAVENOUS
  Administered 2013-07-29: 50 ug via INTRAVENOUS

## 2013-07-29 MED ORDER — SODIUM CHLORIDE 0.9 % IJ SOLN
3.0000 mL | Freq: Two times a day (BID) | INTRAMUSCULAR | Status: DC
  Administered 2013-07-29: 3 mL via INTRAVENOUS

## 2013-07-29 MED ORDER — HYDROMORPHONE HCL PF 1 MG/ML IJ SOLN
0.2500 mg | INTRAMUSCULAR | Status: DC | PRN
  Administered 2013-07-29: 0.5 mg via INTRAVENOUS

## 2013-07-29 MED ORDER — OXYCODONE HCL 5 MG PO TABS: 5.0000 mg | ORAL_TABLET | Freq: Once | ORAL | Status: DC | PRN

## 2013-07-29 MED ORDER — LIDOCAINE-EPINEPHRINE 0.5 %-1:200000 IJ SOLN
INTRAMUSCULAR | Status: DC | PRN
  Administered 2013-07-29: 4 mL

## 2013-07-29 MED ORDER — ONDANSETRON HCL 4 MG/2ML IJ SOLN
INTRAMUSCULAR | Status: DC | PRN
  Administered 2013-07-29: 4 mg via INTRAVENOUS

## 2013-07-29 MED ORDER — THROMBIN 5000 UNITS EX SOLR
CUTANEOUS | Status: DC | PRN
  Administered 2013-07-29 (×2): 5000 [IU] via TOPICAL

## 2013-07-29 MED ORDER — ACETAMINOPHEN 650 MG RE SUPP: 650.0000 mg | RECTAL | Status: DC | PRN

## 2013-07-29 MED ORDER — ONDANSETRON HCL 4 MG/2ML IJ SOLN: 4.0000 mg | INTRAMUSCULAR | Status: DC | PRN

## 2013-07-29 MED ORDER — 0.9 % SODIUM CHLORIDE (POUR BTL) OPTIME
TOPICAL | Status: DC | PRN
  Administered 2013-07-29: 1000 mL

## 2013-07-29 MED ORDER — SODIUM CHLORIDE 0.9 % IJ SOLN: 3.0000 mL | INTRAMUSCULAR | Status: DC | PRN

## 2013-07-29 MED ORDER — ACETAMINOPHEN 325 MG PO TABS: 650.0000 mg | ORAL_TABLET | ORAL | Status: DC | PRN

## 2013-07-29 MED ORDER — MENTHOL 3 MG MT LOZG: 1.0000 | LOZENGE | OROMUCOSAL | Status: DC | PRN

## 2013-07-29 MED ORDER — PHENYLEPHRINE HCL 10 MG/ML IJ SOLN
INTRAMUSCULAR | Status: DC | PRN
  Administered 2013-07-29 (×4): 80 ug via INTRAVENOUS

## 2013-07-29 MED ORDER — SODIUM CHLORIDE 0.9 % IV SOLN: 250.0000 mL | INTRAVENOUS | Status: DC

## 2013-07-29 MED ORDER — OXYCODONE HCL 5 MG/5ML PO SOLN: 5.0000 mg | Freq: Once | ORAL | Status: DC | PRN

## 2013-07-29 MED ORDER — OXYCODONE-ACETAMINOPHEN 5-325 MG PO TABS
1.0000 | ORAL_TABLET | ORAL | Status: DC | PRN
  Administered 2013-07-29: 1 via ORAL
  Administered 2013-07-30: 2 via ORAL
  Filled 2013-07-29 (×2): qty 2

## 2013-07-29 MED ORDER — GLYCOPYRROLATE 0.2 MG/ML IJ SOLN
INTRAMUSCULAR | Status: DC | PRN
  Administered 2013-07-29: 0.6 mg via INTRAVENOUS

## 2013-07-29 MED ORDER — HYDROCODONE-ACETAMINOPHEN 5-325 MG PO TABS: 1.0000 | ORAL_TABLET | ORAL | Status: DC | PRN

## 2013-07-29 MED ORDER — PROPOFOL 10 MG/ML IV BOLUS
INTRAVENOUS | Status: DC | PRN
  Administered 2013-07-29: 200 mg via INTRAVENOUS

## 2013-07-29 MED ORDER — HYDROMORPHONE HCL PF 1 MG/ML IJ SOLN
INTRAMUSCULAR | Status: AC
  Filled 2013-07-29: qty 1

## 2013-07-29 MED ORDER — CYCLOBENZAPRINE HCL 10 MG PO TABS
10.0000 mg | ORAL_TABLET | Freq: Three times a day (TID) | ORAL | Status: DC | PRN
  Administered 2013-07-29 – 2013-07-30 (×2): 10 mg via ORAL
  Filled 2013-07-29 (×2): qty 1

## 2013-07-29 MED ORDER — POTASSIUM CHLORIDE IN NACL 20-0.9 MEQ/L-% IV SOLN
INTRAVENOUS | Status: DC
  Filled 2013-07-29 (×3): qty 1000

## 2013-07-29 MED ORDER — SENNA 8.6 MG PO TABS
1.0000 | ORAL_TABLET | Freq: Two times a day (BID) | ORAL | Status: DC
  Administered 2013-07-29 – 2013-07-30 (×2): 8.6 mg via ORAL
  Filled 2013-07-29 (×2): qty 1

## 2013-07-29 MED ORDER — MIDAZOLAM HCL 5 MG/5ML IJ SOLN
INTRAMUSCULAR | Status: DC | PRN
  Administered 2013-07-29: 2 mg via INTRAVENOUS

## 2013-07-29 MED ORDER — DEXAMETHASONE SODIUM PHOSPHATE 10 MG/ML IJ SOLN
INTRAMUSCULAR | Status: AC
  Administered 2013-07-29: 10 mg via INTRAVENOUS
  Filled 2013-07-29: qty 1

## 2013-07-29 MED ORDER — HEMOSTATIC AGENTS (NO CHARGE) OPTIME
TOPICAL | Status: DC | PRN
  Administered 2013-07-29: 1 via TOPICAL

## 2013-07-29 SURGICAL SUPPLY — 71 items
BANDAGE GAUZE ELAST BULKY 4 IN (GAUZE/BANDAGES/DRESSINGS) ×4 IMPLANT
BIT DRILL NEURO 2X3.1 SFT TUCH (MISCELLANEOUS) ×1 IMPLANT
BLADE SURG ROTATE 9660 (MISCELLANEOUS) IMPLANT
BONE CERV LORDOTIC 14.5X12X9 (Bone Implant) ×2 IMPLANT
BUR DRUM 4.0 (BURR) ×2 IMPLANT
CANISTER SUCTION 2500CC (MISCELLANEOUS) ×2 IMPLANT
CLOTH BEACON ORANGE TIMEOUT ST (SAFETY) ×2 IMPLANT
CONT SPEC 4OZ CLIKSEAL STRL BL (MISCELLANEOUS) ×2 IMPLANT
DECANTER SPIKE VIAL GLASS SM (MISCELLANEOUS) ×2 IMPLANT
DERMABOND ADVANCED (GAUZE/BANDAGES/DRESSINGS) ×1
DERMABOND ADVANCED .7 DNX12 (GAUZE/BANDAGES/DRESSINGS) ×1 IMPLANT
DRAPE LAPAROTOMY 100X72 PEDS (DRAPES) ×2 IMPLANT
DRAPE MICROSCOPE LEICA (MISCELLANEOUS) ×2 IMPLANT
DRAPE POUCH INSTRU U-SHP 10X18 (DRAPES) ×2 IMPLANT
DRAPE PROXIMA HALF (DRAPES) IMPLANT
DRILL BIT HELIX (BIT) ×2 IMPLANT
DRILL NEURO 2X3.1 SOFT TOUCH (MISCELLANEOUS) ×2
DURAPREP 6ML APPLICATOR 50/CS (WOUND CARE) ×2 IMPLANT
ELECT COATED BLADE 2.86 ST (ELECTRODE) ×2 IMPLANT
ELECT REM PT RETURN 9FT ADLT (ELECTROSURGICAL) ×2
ELECTRODE REM PT RTRN 9FT ADLT (ELECTROSURGICAL) ×1 IMPLANT
GAUZE SPONGE 4X4 16PLY XRAY LF (GAUZE/BANDAGES/DRESSINGS) IMPLANT
GLOVE BIO SURGEON STRL SZ 6.5 (GLOVE) IMPLANT
GLOVE BIO SURGEON STRL SZ7 (GLOVE) IMPLANT
GLOVE BIO SURGEON STRL SZ7.5 (GLOVE) IMPLANT
GLOVE BIO SURGEON STRL SZ8 (GLOVE) IMPLANT
GLOVE BIO SURGEON STRL SZ8.5 (GLOVE) IMPLANT
GLOVE BIOGEL M 8.0 STRL (GLOVE) ×2 IMPLANT
GLOVE BIOGEL PI IND STRL 6.5 (GLOVE) ×1 IMPLANT
GLOVE BIOGEL PI INDICATOR 6.5 (GLOVE) ×1
GLOVE ECLIPSE 6.5 STRL STRAW (GLOVE) ×2 IMPLANT
GLOVE ECLIPSE 7.0 STRL STRAW (GLOVE) IMPLANT
GLOVE ECLIPSE 7.5 STRL STRAW (GLOVE) IMPLANT
GLOVE ECLIPSE 8.0 STRL XLNG CF (GLOVE) IMPLANT
GLOVE ECLIPSE 8.5 STRL (GLOVE) IMPLANT
GLOVE EXAM NITRILE LRG STRL (GLOVE) IMPLANT
GLOVE EXAM NITRILE MD LF STRL (GLOVE) IMPLANT
GLOVE EXAM NITRILE XL STR (GLOVE) IMPLANT
GLOVE EXAM NITRILE XS STR PU (GLOVE) IMPLANT
GLOVE INDICATOR 6.5 STRL GRN (GLOVE) IMPLANT
GLOVE INDICATOR 7.0 STRL GRN (GLOVE) ×2 IMPLANT
GLOVE INDICATOR 7.5 STRL GRN (GLOVE) ×2 IMPLANT
GLOVE INDICATOR 8.0 STRL GRN (GLOVE) IMPLANT
GLOVE INDICATOR 8.5 STRL (GLOVE) IMPLANT
GLOVE OPTIFIT SS 8.0 STRL (GLOVE) IMPLANT
GLOVE SURG SS PI 6.5 STRL IVOR (GLOVE) IMPLANT
GOWN BRE IMP SLV AUR LG STRL (GOWN DISPOSABLE) ×2 IMPLANT
GOWN BRE IMP SLV AUR XL STRL (GOWN DISPOSABLE) ×2 IMPLANT
GOWN STRL REIN 2XL LVL4 (GOWN DISPOSABLE) IMPLANT
KIT BASIN OR (CUSTOM PROCEDURE TRAY) ×2 IMPLANT
KIT ROOM TURNOVER OR (KITS) ×2 IMPLANT
NEEDLE HYPO 25X1 1.5 SAFETY (NEEDLE) ×2 IMPLANT
NEEDLE SPNL 22GX3.5 QUINCKE BK (NEEDLE) ×2 IMPLANT
NS IRRIG 1000ML POUR BTL (IV SOLUTION) ×2 IMPLANT
PACK LAMINECTOMY NEURO (CUSTOM PROCEDURE TRAY) ×2 IMPLANT
PAD ARMBOARD 7.5X6 YLW CONV (MISCELLANEOUS) ×6 IMPLANT
PIN DISTRACTION 14MM (PIN) ×4 IMPLANT
PLATE HELIX R 26MM (Plate) ×2 IMPLANT
RUBBERBAND STERILE (MISCELLANEOUS) ×4 IMPLANT
SCREW 4.0X13 (Screw) ×4 IMPLANT
SCREW 4.0X13MM (Screw) ×4 IMPLANT
SPACER CC-ACF 8MM PARALLEL (Bone Implant) IMPLANT
SPONGE INTESTINAL PEANUT (DISPOSABLE) ×2 IMPLANT
SPONGE SURGIFOAM ABS GEL SZ50 (HEMOSTASIS) ×2 IMPLANT
SUT VIC AB 0 CT1 27 (SUTURE) ×1
SUT VIC AB 0 CT1 27XBRD ANTBC (SUTURE) ×1 IMPLANT
SUT VIC AB 3-0 SH 8-18 (SUTURE) ×4 IMPLANT
SYR 20ML ECCENTRIC (SYRINGE) ×2 IMPLANT
TOWEL OR 17X24 6PK STRL BLUE (TOWEL DISPOSABLE) ×2 IMPLANT
TOWEL OR 17X26 10 PK STRL BLUE (TOWEL DISPOSABLE) ×2 IMPLANT
WATER STERILE IRR 1000ML POUR (IV SOLUTION) ×2 IMPLANT

## 2013-07-29 NOTE — Preoperative (Signed)
Beta Blockers   Reason not to administer Beta Blockers:Not Applicable 

## 2013-07-29 NOTE — Anesthesia Preprocedure Evaluation (Addendum)
Anesthesia Evaluation  Patient identified by MRN, date of birth, ID band Patient awake    Reviewed: Allergy & Precautions, H&P , NPO status , Patient's Chart, lab work & pertinent test results  History of Anesthesia Complications Negative for: history of anesthetic complications  Airway Mallampati: II TM Distance: >3 FB Neck ROM: Full    Dental  (+) Teeth Intact and Dental Advisory Given   Pulmonary neg pulmonary ROS, former smoker,    Pulmonary exam normal       Cardiovascular negative cardio ROS      Neuro/Psych negative psych ROS   GI/Hepatic negative GI ROS, Neg liver ROS,   Endo/Other  negative endocrine ROS  Renal/GU negative Renal ROS     Musculoskeletal   Abdominal   Peds  Hematology   Anesthesia Other Findings   Reproductive/Obstetrics                          Anesthesia Physical Anesthesia Plan  ASA: II  Anesthesia Plan: General   Post-op Pain Management:    Induction: Intravenous  Airway Management Planned: Oral ETT  Additional Equipment:   Intra-op Plan:   Post-operative Plan: Extubation in OR  Informed Consent: I have reviewed the patients History and Physical, chart, labs and discussed the procedure including the risks, benefits and alternatives for the proposed anesthesia with the patient or authorized representative who has indicated his/her understanding and acceptance.   Dental advisory given  Plan Discussed with: CRNA, Anesthesiologist and Surgeon  Anesthesia Plan Comments:        Anesthesia Quick Evaluation

## 2013-07-29 NOTE — Op Note (Signed)
07/29/2013  3:10 PM  PATIENT:  Ivan Blackwell  52 y.o. male with significant pain in the right upper extremity and weakness  PRE-OPERATIVE DIAGNOSIS:  cervical herniated disc neck pain C6/7  POST-OPERATIVE DIAGNOSIS:  cervical herniated disc neck pain C6/7  PROCEDURE:  Procedure(s): ANTERIOR CERVICAL DECOMPRESSION/DISCECTOMY FUSION PLATING BONEGRAFT CERVICAL SIX-SEVEN Structural allograft 9mm synthes Nuvasive Helix 26mm plate  SURGEON:  Surgeon(s): Carmela Hurt, MD  ASSISTANTS:Botero, Lynne Logan  ANESTHESIA:   general  EBL:  Total I/O In: 1000 [I.V.:1000] Out: 200 [Blood:200]  BLOOD ADMINISTERED:none  CELL SAVER GIVEN:none  COUNT:per nursing  DRAINS: none   SPECIMEN:  No Specimen  DICTATION: [name] was brought to the operating room, intubated, and placed under general anesthesia without difficulty. He was positioned on the bed with his head in a neutral position on a horseshoe headrest. His neck was prepped and draped in a sterile manner. I infiltrated 4cc lidocaine into the neck at the level of the cricothyroid membrane, from the midline to the medial border of the left sternocleidomastoid muscle. I opened the skin with a 10 blade and continued the dissection sharply to the platysma. I dissected in the plane superior to the platysma rostrally and caudally to increase my working space. I divided the platysma horizontally then dissected sharply and bluntly to the cervical spine. I exposed the cervical spine and localized by placing a needle into what was confirmed as C6/7.  I decompressed the spinal canal at C6/7 and the C7 roots bilaterally in the following manner. I used a 15 blade to incise the disc space. I used curettes, rongeurs, and Kerrison punches to remove the disc, osteophytes, and endplate. I used the drill to remove osteophytes and to create space around the canal and nerve roots. I took the uncovertebral joints bilaterally to fully decompress the C7 roots.  I prepared  for the arthrodesis by smoothing  the surfaces of C6 and C7 to accept a 9mm graft. We placed the 9mm graft without difficulty to complete the arthrodesis. Dr. Jeral Fruit and I placed the plate and screws using the drill and self tapping screws. We used a nuvasive helix plate, 26mm.  I irrigated, then we closed the wound in layers, after confirming the proper level of the plate and interbody on xray. I used dermabond for a sterile dressing. The platysma and subcuticular layers were close with vicryl sutures. I used dermabond for a sterile dressing.   PLAN OF CARE: Admit for overnight observation  PATIENT DISPOSITION:  PACU - hemodynamically stable.   Delay start of Pharmacological VTE agent (>24hrs) due to surgical blood loss or risk of bleeding:  yes

## 2013-07-29 NOTE — Progress Notes (Signed)
Pt. Having to be reminded to take deep breaths now after having pain meds.

## 2013-07-29 NOTE — Transfer of Care (Signed)
Immediate Anesthesia Transfer of Care Note  Patient: Ivan Blackwell  Procedure(s) Performed: Procedure(s): ANTERIOR CERVICAL DECOMPRESSION/DISCECTOMY FUSION PLATING BONEGRAFT CERVICAL SIX-SEVEN (N/A)  Patient Location: PACU  Anesthesia Type:General  Level of Consciousness: awake and alert   Airway & Oxygen Therapy: Patient Spontanous Breathing and Patient connected to nasal cannula oxygen  Post-op Assessment: Report given to PACU RN, Post -op Vital signs reviewed and stable and Patient moving all extremities  Post vital signs: Reviewed and stable  Complications: No apparent anesthesia complications

## 2013-07-29 NOTE — Progress Notes (Deleted)
Pt refusing CBG. Pt states he is not diabetic. Rema Fendt, RN

## 2013-07-29 NOTE — H&P (Signed)
BP 139/95  Pulse 66  Temp(Src) 96.8 F (36 C) (Oral)  Resp 18  SpO2 100%    Ivan Blackwell is a 52 year old gentleman who presents today for evaluation of pain which he has in his right upper extremity. He has had this pain since 06/10/2013. He says that he was initially placed on a prednisone taper, and that worked well for two days, but then became much less effective. He takes Percocet for the pain, but that is not working all that well either. He says he simply woke up with this problem. There was no antecedent trauma. He has never had pain or weakness like this in the past. The pain will extend from the shoulder into the forearm ending at the wrist. He is right handed. 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   REVIEW OF SYSTEMS:                                    He denies constitutional eye, ear, nose, throat, mouth, cardiovascular, respiratory, gastrointestinal, genitourinary, skin, neurological, psychiatric, endocrine, hematologic, or allergic problems.   On his pain chart he heavily darkened the right upper extremity and right upper extremity only.      PAST MEDICAL HISTORY:                                Otherwise good.            Prior Operations:  No previous operations.            Medications and Allergies:  He takes Millipred and oxycodone. NO KNOWN DRUG ALLERGIES.   FAMILY HISTORY:                                            He did not provide.   SOCIAL HISTORY:                                            He does not smoke. He does drink alcohol. He does not use illicit drugs.   PHYSICAL EXAMINATION:                                He is 73.62 inches in height. He weighs 163 pounds. Blood pressure is 151/85. Pulse is 85 and temperature was not recorded.            Cardiovascular - Pulses are good at the wrist, bilaterally. No cervical masses or bruits are present.     NEUROLOGICAL EXAMINATION:           He is alert an oriented x4 and answering all questions  appropriately. Memory, language, attention span, and fund of knowledge are normal. He is well kempt, and in obvious discomfort.            Cranial Nerve Examination - Pupils equal, round and reactive to light. Full extraocular movements.  Full visual fields. Hearing intact to finger rub bilaterally. Uvula elevates in the midline. Shoulder shrug is normal. Tongue protrudes in the midline.  Motor Examination - He is profoundly weak in the triceps, only 4 to 4-/5. He otherwise has normal strength throughout the upper and lower extremities.   Sensory Examination - Proprioception intact in both upper and lower extremities. Light touch intact in both upper an lower extremities, slightly decreased in the right C7 distribution.            Deep Tendon Reflexes - Depressed triceps reflexes on the right 1 vs 2 on the left. 2+ brachioradialis and biceps. Normal knee and ankle jerks, bilaterally.            Cerebellar Examination - Muscle tone, bulk, and coordination are normal. Gait is normal. Romberg test was negative.   DATA:                                                              MRI, cervical spine shows a normal lordosis. He has a herniated disc on the right side at C6-7, which is somewhat lateral. Spinal cord is normal in its appearance. No abnormalities in the paraspinous soft tissue.   DIAGNOSIS:                                                     1. Displaced disc, C6/7. 2. Right C7 radiculopathy.   ASSESSMENT/PLAN:                                       I have explained to Mr. Graham because of his weakness that I would strongly recommend surgery. He still has a great deal of pain and discomfort and while we did discuss other options, injections and physical therapy. Again with his weakness I just did not feel that those were reasonable options for him. I gave him a detailed instruction sheet about this. He wants to go over this for a period of time with his wife and he will let us  know what he would like to do. Risks including bleeding, infection, no relief, need for further surgery, fusion failure, hardware failure, damage to the recurrent laryngeal nerve, damage to the carotid artery, damage to the vertebral artery, spinal cord damage all were discussed. He understands and will give it due consideration.

## 2013-07-29 NOTE — Anesthesia Procedure Notes (Signed)
Procedure Name: Intubation Date/Time: 07/29/2013 12:13 PM Performed by: Sharlene Dory E Pre-anesthesia Checklist: Patient identified, Emergency Drugs available, Suction available, Patient being monitored and Timeout performed Patient Re-evaluated:Patient Re-evaluated prior to inductionOxygen Delivery Method: Circle system utilized Preoxygenation: Pre-oxygenation with 100% oxygen Intubation Type: IV induction Ventilation: Mask ventilation without difficulty Laryngoscope Size: Mac and 4 Grade View: Grade I Tube type: Oral Tube size: 7.5 mm Number of attempts: 1 Airway Equipment and Method: Stylet and LTA kit utilized Placement Confirmation: ETT inserted through vocal cords under direct vision,  positive ETCO2 and breath sounds checked- equal and bilateral Secured at: 23 cm Tube secured with: Tape Dental Injury: Teeth and Oropharynx as per pre-operative assessment

## 2013-07-29 NOTE — Anesthesia Postprocedure Evaluation (Signed)
Anesthesia Post Note  Patient: Ivan Blackwell  Procedure(s) Performed: Procedure(s) (LRB): ANTERIOR CERVICAL DECOMPRESSION/DISCECTOMY FUSION PLATING BONEGRAFT CERVICAL SIX-SEVEN (N/A)  Anesthesia type: general  Patient location: PACU  Post pain: Pain level controlled  Post assessment: Patient's Cardiovascular Status Stable  Last Vitals:  Filed Vitals:   07/29/13 1545  BP: 137/80  Pulse: 83  Temp:   Resp: 14    Post vital signs: Reviewed and stable  Level of consciousness: sedated  Complications: No apparent anesthesia complications

## 2013-07-30 NOTE — Discharge Summary (Signed)
Physician Discharge Summary  Patient ID: Ivan Blackwell MRN: [id number] DOB/AGE: 50-01-1961 50 y.o.  Admit date: 07/29/2013 Discharge date: 07/30/2013  Admission Diagnoses:Cervical spondylosis C6/7, right upper extremity pain  Discharge Diagnoses: Cervical spondylosis C6/7, right upper extremity pain Active Problems:   * No active hospital problems. *   Discharged Condition: good  Hospital Course: [name] was taken to the operating room and underwent an uncomplicated ACDF At C6/7. Post op he is doing well, speaking voice is strong, moving all extremities well. Wound is clean dry and without signs of infection.    Consults: None  Significant Diagnostic Studies: none Treatments: surgery: As above, nuvasive hardware, helix Discharge Exam: Blood pressure 126/89, pulse 90, temperature 97.5 F (36.4 C), temperature source Oral, resp. rate 18, SpO2 95.00%. General appearance: alert, cooperative, appears stated age and no distress Neurologic: Alert and oriented X 3, normal strength and tone. Normal symmetric reflexes. Normal coordination and gait  Disposition: 01-Home or Self Care     Medication List         oxyCODONE-acetaminophen 5-325 MG per tablet  Commonly known as:  ROXICET  Take 1 tablet by mouth every 4 (four) hours as needed for pain.     predniSONE 20 MG tablet  Commonly known as:  DELTASONE  Take 3 a day for 3 days 2 a day for 3 days one a day for 3 days           Follow-up Information   Follow up with Villa Burgin L, MD In 4 weeks. (call to make appointment)    Specialty:  Neurosurgery   Contact information:   [address]                         [address] [place] [address] [phone]       Signed: Ido Wollman L 07/30/2013, 1:28 PM

## 2013-07-30 NOTE — Progress Notes (Signed)
Pt. Alert and oriented,follows simple instructions, denies pain. Incision area without swelling, redness or S/S of infection. Voiding adequate clear yellow urine. Moving all extremities well and vitals stable and documented. Cervical surgery notes instructions given to patient and family member for home safety and precautions. Pt. and family stated understanding of instructions given. 

## 2013-08-05 ENCOUNTER — Encounter (HOSPITAL_COMMUNITY): Payer: Self-pay | Admitting: Neurosurgery

## 2013-08-14 ENCOUNTER — Telehealth: Payer: Self-pay

## 2013-08-14 NOTE — Telephone Encounter (Signed)
Called pt, LMOM to CB. 

## 2013-08-14 NOTE — Telephone Encounter (Signed)
Patient is going to have a drug screen for his job and he needs documentation saying that he is taking oxycodone please call him at [phone]

## 2013-08-16 ENCOUNTER — Encounter: Payer: Self-pay | Admitting: *Deleted

## 2013-08-16 NOTE — Telephone Encounter (Signed)
Called vm not set up yet.

## 2013-08-17 NOTE — Telephone Encounter (Signed)
Left message for him to call me back.  

## 2013-08-17 NOTE — Telephone Encounter (Signed)
Patient was prescribed Oxycodone on 07/18/13.

## 2013-08-19 NOTE — Telephone Encounter (Signed)
Note done for pt.

## 2014-03-25 ENCOUNTER — Ambulatory Visit (INDEPENDENT_AMBULATORY_CARE_PROVIDER_SITE_OTHER): Payer: PRIVATE HEALTH INSURANCE | Admitting: Physician Assistant

## 2014-03-25 ENCOUNTER — Encounter: Payer: Self-pay | Admitting: Internal Medicine

## 2014-03-25 VITALS — BP 120/80 | HR 61 | Temp 97.7°F | Resp 16 | Ht 72.5 in | Wt 173.4 lb

## 2014-03-25 DIAGNOSIS — Z0000 Encounter for general adult medical examination without abnormal findings: Secondary | ICD-10-CM

## 2014-03-25 DIAGNOSIS — Z1159 Encounter for screening for other viral diseases: Secondary | ICD-10-CM

## 2014-03-25 DIAGNOSIS — Z23 Encounter for immunization: Secondary | ICD-10-CM

## 2014-03-25 DIAGNOSIS — Z1211 Encounter for screening for malignant neoplasm of colon: Secondary | ICD-10-CM

## 2014-03-25 LAB — POCT URINALYSIS DIPSTICK
Bilirubin, UA: NEGATIVE
Glucose, UA: NEGATIVE
KETONES UA: NEGATIVE
Leukocytes, UA: NEGATIVE
Nitrite, UA: NEGATIVE
PH UA: 5.5
PROTEIN UA: NEGATIVE
SPEC GRAV UA: 1.025
UROBILINOGEN UA: 0.2

## 2014-03-25 LAB — COMPREHENSIVE METABOLIC PANEL
ALBUMIN: 4.3 g/dL (ref 3.5–5.2)
ALK PHOS: 51 U/L (ref 39–117)
ALT: 24 U/L (ref 0–53)
AST: 22 U/L (ref 0–37)
BUN: 13 mg/dL (ref 6–23)
CALCIUM: 9.2 mg/dL (ref 8.4–10.5)
CO2: 24 meq/L (ref 19–32)
CREATININE: 0.91 mg/dL (ref 0.50–1.35)
Chloride: 106 mEq/L (ref 96–112)
GLUCOSE: 102 mg/dL — AB (ref 70–99)
Potassium: 4.6 mEq/L (ref 3.5–5.3)
Sodium: 139 mEq/L (ref 135–145)
TOTAL PROTEIN: 6.9 g/dL (ref 6.0–8.3)
Total Bilirubin: 0.7 mg/dL (ref 0.2–1.2)

## 2014-03-25 LAB — POCT CBC
Granulocyte percent: 60.8 %G (ref 37–80)
HEMATOCRIT: 44.5 % (ref 43.5–53.7)
Hemoglobin: 14.5 g/dL (ref 14.1–18.1)
LYMPH, POC: 1.6 (ref 0.6–3.4)
MCH, POC: 30.2 pg (ref 27–31.2)
MCHC: 32.6 g/dL (ref 31.8–35.4)
MCV: 92.8 fL (ref 80–97)
MID (CBC): 0.3 (ref 0–0.9)
MPV: 9.2 fL (ref 0–99.8)
PLATELET COUNT, POC: 248 10*3/uL (ref 142–424)
POC GRANULOCYTE: 3 (ref 2–6.9)
POC LYMPH %: 32.3 % (ref 10–50)
POC MID %: 6.9 %M (ref 0–12)
RBC: 4.8 M/uL (ref 4.69–6.13)
RDW, POC: 13.8 %
WBC: 5 10*3/uL (ref 4.6–10.2)

## 2014-03-25 LAB — TSH: TSH: 1.182 u[IU]/mL (ref 0.350–4.500)

## 2014-03-25 LAB — LIPID PANEL
Cholesterol: 175 mg/dL (ref 0–200)
HDL: 42 mg/dL (ref >39)
LDL CALC: 112 mg/dL — AB (ref 0–99)
Total CHOL/HDL Ratio: 4.2 Ratio
Triglycerides: 107 mg/dL (ref <150)
VLDL: 21 mg/dL (ref 0–40)

## 2014-03-25 LAB — HEPATITIS C ANTIBODY: HCV Ab: NEGATIVE

## 2014-03-25 NOTE — Progress Notes (Signed)
Subjective:    Patient ID: Ivan Blackwell, male    DOB: 04-13-1961, 53 y.o.   MRN: [id number]  HPI  Ivan Blackwell is a 53 yr old male here for CPE.  He appears to have been brought today by his wife and he has very little interest in participating in the visit.  Last CPE unknown.  Complaints:  none Imm:  Last tetanus over 10 yrs ago - will update today.  He repeatedly tells me he does not want a flu shot. Dentist:  None - does not want recommendation for dentist Eye doctor:  Reading glasses - no interest in eye exam, "it's not pertinent to my health, I can live to a hundred with bad eyes." Diet:  Varied.  Minimal junk food or fast food.  Wife is concerned for his sugar intake.  Drinks soda, juice - "but not excessive" Exercise:  "get my share of exercise with work" - walking and stairs Family history:   Unknown - "my family is dead and gone" Meds: multivitamin PMH:  Neck surgery  Work:  "I don't want to say"   Former smoker - quit 2007 Little to no etoh  Review of Systems  Constitutional: Negative.   HENT: Negative.   Eyes: Negative.   Respiratory: Negative.   Cardiovascular: Negative.   Gastrointestinal: Negative.   Musculoskeletal: Negative.   Skin: Negative.        Objective:   Physical Exam  Vitals reviewed. Constitutional: He is oriented to person, place, and time. He appears well-developed and well-nourished. No distress.  HENT:  Head: Normocephalic and atraumatic.  Right Ear: Tympanic membrane and ear canal normal.  Left Ear: Tympanic membrane and ear canal normal.  Mouth/Throat: Uvula is midline, oropharynx is clear and moist and mucous membranes are normal.  Eyes: EOM are normal. Pupils are equal, round, and reactive to light.  Neck: Normal range of motion. Neck supple. No thyromegaly present.  Cardiovascular: Normal rate, regular rhythm, normal heart sounds and intact distal pulses.   Pulmonary/Chest: Effort normal and breath sounds normal. He has no wheezes. He has  no rales.  Abdominal: Soft. Bowel sounds are normal. There is no tenderness.  Lymphadenopathy:    He has no cervical adenopathy.  Neurological: He is alert and oriented to person, place, and time. He has normal reflexes.  Skin: Skin is warm and dry.  Psychiatric: His speech is normal and behavior is normal. His affect is blunt.        Assessment & Plan:  Routine general medical examination at a health care facility - Plan: Tdap vaccine greater than or equal to 7yo IM, POCT CBC, POCT urinalysis dipstick, Comprehensive metabolic panel, Hepatitis C antibody, Lipid panel, TSH  Need for Tdap vaccination - Plan: Tdap vaccine greater than or equal to 7yo IM  Special screening for malignant neoplasms, colon - Plan: Ambulatory referral to Gastroenterology  Need for hepatitis C screening test - Plan: Hepatitis C antibody   Ivan Blackwell is a 53 yr old male here for CPE.  He reports good health, and his exam is normal.  He has a bit of a strange affect, but his behavior, thought content, and judgment appear normal.  Will do routine labs - CBC, CMP, TSH, lipid panel, UA.  Will screen for hep C.  Pt declines HIV testing.  Will update tetanus today, and will refer for screening colonoscopy.  Discussed health maintenance.   Loleta DickerE. Elizabeth Makhya Arave MHS, PA-C [facility] 4/9/201511:27 AM

## 2014-03-25 NOTE — Patient Instructions (Signed)
We have updated your tetanus shot today - this is good for 10 yrs  I have placed a referral for a colonoscopy which is recommended starting at age 51  I will let you know when your labs are back and if we need to do anything based on them   Health Maintenance, Males A healthy lifestyle and preventative care can promote health and wellness.  Maintain regular health, dental, and eye exams.  Eat a healthy diet. Foods like vegetables, fruits, whole grains, low-fat dairy products, and lean protein foods contain the nutrients you need and are low in calories. Decrease your intake of foods high in solid fats, added sugars, and salt. Get information about a proper diet from your health care provider, if necessary.  Regular physical exercise is one of the most important things you can do for your health. Most adults should get at least 150 minutes of moderate-intensity exercise (any activity that increases your heart rate and causes you to sweat) each week. In addition, most adults need muscle-strengthening exercises on 2 or more days a week.   Maintain a healthy weight. The body mass index (BMI) is a screening tool to identify possible weight problems. It provides an estimate of body fat based on height and weight. Your health care provider can find your BMI and can help you achieve or maintain a healthy weight. For males 20 years and older:  A BMI below 18.5 is considered underweight.  A BMI of 18.5 to 24.9 is normal.  A BMI of 25 to 29.9 is considered overweight.  A BMI of 30 and above is considered obese.  Maintain normal blood lipids and cholesterol by exercising and minimizing your intake of saturated fat. Eat a balanced diet with plenty of fruits and vegetables. Blood tests for lipids and cholesterol should begin at age 51 and be repeated every 5 years. If your lipid or cholesterol levels are high, you are over 50, or you are at high risk for heart disease, you may need your cholesterol  levels checked more frequently.Ongoing high lipid and cholesterol levels should be treated with medicines, if diet and exercise are not working.  If you smoke, find out from your health care provider how to quit. If you do not use tobacco, do not start.  Lung cancer screening is recommended for adults aged 51 80 years who are at high risk for developing lung cancer because of a history of smoking. A yearly low-dose CT scan of the lungs is recommended for people who have at least a 30-pack-year history of smoking and are a current smoker or have quit within the past 15 years. A pack year of smoking is smoking an average of 1 pack of cigarettes a day for 1 year (for example, a 30-pack-year history of smoking could mean smoking 1 pack a day for 30 years or 2 packs a day for 15 years). Yearly screening should continue until the smoker has stopped smoking for at least 15 years. Yearly screening should be stopped for people who develop a health problem that would prevent them from having lung cancer treatment.  If you choose to drink alcohol, do not have more than 2 drinks per day. One drink is considered to be 12 oz (360 mL) of beer, 5 oz (150 mL) of wine, or 1.5 oz (45 mL) of liquor.  Avoid use of street drugs. Do not share needles with anyone. Ask for help if you need support or instructions about stopping the use of  drugs.  High blood pressure causes heart disease and increases the risk of stroke. Blood pressure should be checked at least every 1 2 years. Ongoing high blood pressure should be treated with medicines if weight loss and exercise are not effective.  If you are 63 51 years old, ask your health care provider if you should take aspirin to prevent heart disease.  Diabetes screening involves taking a blood sample to check your fasting blood sugar level. This should be done once every 3 years after age 7, if you are at a normal weight and without risk factors for diabetes. Testing should be  considered at a younger age or be carried out more frequently if you are overweight and have at least 1 risk factor for diabetes.  Colorectal cancer can be detected and often prevented. Most routine colorectal cancer screening begins at the age of 44 and continues through age 51. However, your health care provider may recommend screening at an earlier age if you have risk factors for colon cancer. On a yearly basis, your health care provider may provide home test kits to check for hidden blood in the stool. A small camera at the end of a tube may be used to directly examine the colon (sigmoidoscopy or colonoscopy) to detect the earliest forms of colorectal cancer. Talk to your health care provider about this at age 57, when routine screening begins. A direct exam of the colon should be repeated every 5 10 years through age 65, unless early forms of pre-cancerous polyps or small growths are found.  People who are at an increased risk for hepatitis B should be screened for this virus. You are considered at high risk for hepatitis B if:  You were born in a country where hepatitis B occurs often. Talk with your health care provider about which countries are considered high-risk.  Your parents were born in a high-risk country and you have not received a shot to protect against hepatitis B (hepatitis B vaccine).  You have HIV or AIDS.  You use needles to inject street drugs.  You live with, or have sex with, someone who has hepatitis B.  You are a man who has sex with other men (MSM).  You get hemodialysis treatment.  You take certain medicines for conditions like cancer, organ transplantation, and autoimmune conditions.  Hepatitis C blood testing is recommended for all people born from [birth year] through 1965 and any individual with known risk factors for hepatitis C.  Healthy men should no longer receive prostate-specific antigen (PSA) blood tests as part of routine cancer screening. Talk to your  health care provider about prostate cancer screening.  Testicular cancer screening is not recommended for adolescents or adult males who have no symptoms. Screening includes self-exam, a health care provider exam, and other screening tests. Consult with your health care provider about any symptoms you have or any concerns you have about testicular cancer.  Practice safe sex. Use condoms and avoid high-risk sexual practices to reduce the spread of sexually transmitted infections (STIs).  Use sunscreen. Apply sunscreen liberally and repeatedly throughout the day. You should seek shade when your shadow is shorter than you. Protect yourself by wearing long sleeves, pants, a wide-brimmed hat, and sunglasses year round, whenever you are outdoors.  Tell your health care provider of new moles or changes in moles, especially if there is a change in shape or color. Also tell your provider if a mole is larger than the size of a pencil eraser.  A one-time screening for abdominal aortic aneurysm (AAA) and surgical repair of large AAAs by ultrasound is recommended for men aged 27 75 years who are current or former smokers.  Stay current with your vaccines (immunizations). Document Released: 05/31/2008 Document Revised: 09/23/2013 Document Reviewed: 04/30/2011 [facility] Patient Information 2014 [place], [place].

## 2014-04-05 ENCOUNTER — Telehealth: Payer: Self-pay

## 2014-04-05 NOTE — Telephone Encounter (Signed)
Patient's wife in office to get results of patient's bloodwork.  Told her I would check on this and call her back.  Reviewed with Heather.  Called patient to give results.   Spoke with [name] who stated he had gotten a call last week and knew bloodwork was normal.

## 2014-05-03 ENCOUNTER — Ambulatory Visit (AMBULATORY_SURGERY_CENTER): Payer: Self-pay | Admitting: *Deleted

## 2014-05-03 VITALS — Ht 72.5 in | Wt 172.0 lb

## 2014-05-03 DIAGNOSIS — Z1211 Encounter for screening for malignant neoplasm of colon: Secondary | ICD-10-CM

## 2014-05-03 MED ORDER — NA SULFATE-K SULFATE-MG SULF 17.5-3.13-1.6 GM/177ML PO SOLN: ORAL | Status: DC

## 2014-05-03 NOTE — Progress Notes (Signed)
Patient denies any allergies to eggs or soy. Patient denies any problems with anesthesia/sedation. Patient denies any oxygen use at home and does not take any diet/weight loss medications. EMMI education assisgned to patient on colonoscopy, this was explained and instructions given to patient. Patient seems upset and unhappy about having this colonoscopy, offered to make office appointment with Dr.Gessner, patient declines this at this time and wants to go ahead and proceed with colonoscopy.

## 2014-05-16 ENCOUNTER — Telehealth: Payer: Self-pay | Admitting: Internal Medicine

## 2014-05-16 NOTE — Telephone Encounter (Signed)
The patient called angrily that his colonoscopy prep was not called in to [facility]. I called this in for him. Interestingly, I checked our medical record in Epic and it appeared that it had been sent. I contacted the pharmacist to [facility] and she had no explanation for the disconnect. I will share with our administrator to possibly help sort out to avoid similar issues in the future.

## 2014-05-17 ENCOUNTER — Ambulatory Visit (AMBULATORY_SURGERY_CENTER): Payer: PRIVATE HEALTH INSURANCE | Admitting: Internal Medicine

## 2014-05-17 ENCOUNTER — Encounter: Payer: Self-pay | Admitting: Internal Medicine

## 2014-05-17 VITALS — BP 111/74 | HR 55 | Temp 96.7°F | Resp 33 | Ht 72.0 in | Wt 172.0 lb

## 2014-05-17 DIAGNOSIS — K5730 Diverticulosis of large intestine without perforation or abscess without bleeding: Secondary | ICD-10-CM

## 2014-05-17 DIAGNOSIS — Z1211 Encounter for screening for malignant neoplasm of colon: Secondary | ICD-10-CM

## 2014-05-17 MED ORDER — SODIUM CHLORIDE 0.9 % IV SOLN: 500.0000 mL | INTRAVENOUS | Status: DC

## 2014-05-17 NOTE — Patient Instructions (Addendum)
No polyps today! You do have a condition called diverticulosis - common and not usually a problem. Please read the handout provided.  Next routine colonoscopy in 10 years - 2025  I appreciate the opportunity to care for you. Iva Booparl E. Neilah Fulwider, MD, FACG   YOU HAD AN ENDOSCOPIC PROCEDURE TODAY AT THE [facility]: Refer to the procedure report that was given to you for any specific questions about what was found during the examination.  If the procedure report does not answer your questions, please call your gastroenterologist to clarify.  If you requested that your care partner not be given the details of your procedure findings, then the procedure report has been included in a sealed envelope for you to review at your convenience later.  YOU SHOULD EXPECT: Some feelings of bloating in the abdomen. Passage of more gas than usual.  Walking can help get rid of the air that was put into your GI tract during the procedure and reduce the bloating. If you had a lower endoscopy (such as a colonoscopy or flexible sigmoidoscopy) you may notice spotting of blood in your stool or on the toilet paper. If you underwent a bowel prep for your procedure, then you may not have a normal bowel movement for a few days.  DIET: Your first meal following the procedure should be a light meal and then it is ok to progress to your normal diet.  A half-sandwich or bowl of soup is an example of a good first meal.  Heavy or fried foods are harder to digest and may make you feel nauseous or bloated.  Likewise meals heavy in dairy and vegetables can cause extra gas to form and this can also increase the bloating.  Drink plenty of fluids but you should avoid alcoholic beverages for 24 hours.  ACTIVITY: Your care partner should take you home directly after the procedure.  You should plan to take it easy, moving slowly for the rest of the day.  You can resume normal activity the day after the procedure however you should NOT  DRIVE or use heavy machinery for 24 hours (because of the sedation medicines used during the test).    SYMPTOMS TO REPORT IMMEDIATELY: A gastroenterologist can be reached at any hour.  During normal business hours, 8:30 AM to 5:00 PM Monday through Friday, call [phone].  After hours and on weekends, please call the GI answering service at [phone] who will take a message and have the physician on call contact you.   Following lower endoscopy (colonoscopy or flexible sigmoidoscopy):  Excessive amounts of blood in the stool  Significant tenderness or worsening of abdominal pains  Swelling of the abdomen that is new, acute  Fever of 100F or higher    FOLLOW UP: If any biopsies were taken you will be contacted by phone or by letter within the next 1-3 weeks.  Call your gastroenterologist if you have not heard about the biopsies in 3 weeks.  Our staff will call the home number listed on your records the next business day following your procedure to check on you and address any questions or concerns that you may have at that time regarding the information given to you following your procedure. This is a courtesy call and so if there is no answer at the home number and we have not heard from you through the emergency physician on call, we will assume that you have returned to your regular daily activities without incident.  SIGNATURES/CONFIDENTIALITY: You and/or your care partner have signed paperwork which will be entered into your electronic medical record.  These signatures attest to the fact that that the information above on your After Visit Summary has been reviewed and is understood.  Full responsibility of the confidentiality of this discharge information lies with you and/or your care-partner.   INFORMATION ON DIVERTICULOSIS GIVEN TO YOU TODAY

## 2014-05-17 NOTE — Op Note (Signed)
[facility] [address] [place], [address]   COLONOSCOPY PROCEDURE REPORT  PATIENT: [name], Ivan Blackwell  MR#: [id number] BIRTHDATE: Feb 22, 1961 , 52  yrs. old GENDER: Male ENDOSCOPIST: Iva Boop, MD, [facility] REFERRED BY:   Frances Furbish, PA-C PROCEDURE DATE:  05/17/2014 PROCEDURE:   Colonoscopy, screening First Screening Colonoscopy - Avg.  risk and is 50 yrs.  old or older Yes.  Prior Negative Screening - Now for repeat screening. N/A  History of Adenoma - Now for follow-up colonoscopy & has been > or = to 3 yrs.  N/A  Polyps Removed Today? No.  Recommend repeat exam, <10 yrs? No. ASA CLASS:   Class II INDICATIONS:average risk screening and first colonoscopy. MEDICATIONS: Propofol (Diprivan) 270 mg IV, MAC sedation, administered by CRNA, and These medications were titrated to patient response per physician's verbal order  DESCRIPTION OF PROCEDURE:   After the risks benefits and alternatives of the procedure were thoroughly explained, informed consent was obtained.  A digital rectal exam revealed no abnormalities of the rectum, A digital rectal exam revealed no prostatic nodules, and A digital rectal exam revealed the prostate was not enlarged.   The [id number]  endoscope was introduced through the anus and advanced to the cecum, which was identified by both the appendix and ileocecal valve. No adverse events experienced.   The quality of the prep was excellent using Suprep  The instrument was then slowly withdrawn as the colon was fully examined.  COLON FINDINGS: There was mild scattered diverticulosis noted in the left colon.   The colon mucosa was otherwise normal.   A right colon retroflexion was performed.  Retroflexed views revealed no abnormalities. The time to cecum=3 minutes 17 seconds.  Withdrawal time=7 minutes 0 seconds.  The scope was withdrawn and the procedure completed. COMPLICATIONS: There were no complications.  ENDOSCOPIC  IMPRESSION: 1.   There was mild diverticulosis noted in the left colon 2.   The colon mucosa was otherwise normal  RECOMMENDATIONS: Repeat colonoscopy 10 years - 2020   eSigned:  Iva Boop, MD, [facility] 05/17/2014 11:35 AM   cc: The Patient  and EMilana Kidney, PA-C

## 2014-05-18 ENCOUNTER — Telehealth: Payer: Self-pay | Admitting: *Deleted

## 2014-05-18 NOTE — Telephone Encounter (Signed)
  Follow up Call-  Call back number 05/17/2014  Post procedure Call Back phone  # [phone]  Permission to leave phone message Yes     Patient questions:  Do you have a fever, pain , or abdominal swelling? no Pain Score  0 *  Have you tolerated food without any problems? yes  Have you been able to return to your normal activities? yes  Do you have any questions about your discharge instructions: Diet   no Medications  no Follow up visit  no  Do you have questions or concerns about your Care? no  Actions: * If pain score is 4 or above: No action needed, pain <4.  Pt. Answered phone, "I'm good".  Identified my self as Ardeen Jourdain from [facility].  Inquired if he had any questions, and was told no. Encouraged him to call if any questions or concerns arise.

## 2015-03-20 ENCOUNTER — Ambulatory Visit (INDEPENDENT_AMBULATORY_CARE_PROVIDER_SITE_OTHER): Payer: PRIVATE HEALTH INSURANCE | Admitting: Physician Assistant

## 2015-03-20 VITALS — BP 118/78 | HR 70 | Temp 97.8°F | Resp 18 | Ht 73.75 in | Wt 171.8 lb

## 2015-03-20 DIAGNOSIS — H9193 Unspecified hearing loss, bilateral: Secondary | ICD-10-CM

## 2015-03-20 DIAGNOSIS — H6123 Impacted cerumen, bilateral: Secondary | ICD-10-CM | POA: Diagnosis not present

## 2015-03-20 NOTE — Progress Notes (Signed)
  Medical screening examination/treatment/procedure(s) were performed by non-physician practitioner and as supervising physician I was immediately available for consultation/collaboration.     

## 2015-03-20 NOTE — Progress Notes (Signed)
   Subjective:    Patient ID: Ivan Blackwell, male    DOB: 1961/04/10, 54 y.o.   MRN: [id number]  HPI Patient presents for 1 week of difficulty hearing after taking a bath. Feels like sound is decreased and hears roaring in left ear. Denies trauma, ear/sinus pressure/pain, congestion, rhinorrhea, cough, HA, dizziness, or fever. Does not clean ears. Has never had this happen before. H/o seasonal allergies, but not asthma. NKDA.   Review of Systems  Constitutional: Negative for fever.  HENT: Positive for hearing loss. Negative for congestion, ear discharge, ear pain, rhinorrhea and sinus pressure.   Neurological: Negative for dizziness and headaches.       Objective:   Physical Exam  Constitutional: He is oriented to person, place, and time. He appears well-developed and well-nourished. No distress.  Blood pressure 118/78, pulse 70, temperature 97.8 F (36.6 C), temperature source Oral, resp. rate 18, height 6' 1.75" (1.873 m), weight 171 lb 12.8 oz (77.928 kg), SpO2 98 %.  HENT:  Head: Normocephalic and atraumatic.  Right Ear: External ear normal. No drainage, swelling or tenderness. A foreign body (cerumen impaction) is present. Decreased hearing is noted.  Left Ear: External ear normal. No drainage, swelling or tenderness. A foreign body (cerumen impaction) is present. Decreased hearing is noted.  Nose: Nose normal. No mucosal edema or rhinorrhea. Right sinus exhibits no maxillary sinus tenderness and no frontal sinus tenderness. Left sinus exhibits no maxillary sinus tenderness and no frontal sinus tenderness.  Mouth/Throat: Uvula is midline, oropharynx is clear and moist and mucous membranes are normal. No oropharyngeal exudate.  Eyes: Conjunctivae are normal. Pupils are equal, round, and reactive to light. Right eye exhibits no discharge. Left eye exhibits no discharge. No scleral icterus.  Neck: Normal range of motion. Neck supple. No thyromegaly present.  Lymphadenopathy:    He has no  cervical adenopathy.  Neurological: He is alert and oriented to person, place, and time.  Skin: Skin is warm and dry. No rash noted. He is not diaphoretic. No erythema. No pallor.      Assessment & Plan:  1. Cerumen impaction, bilateral 2. Decreased hearing, bilateral Hearing improved with ear irrigation. Should use debrox drops to keep ears clean.   Janan Ridgeishira Aydin Cavalieri PA-C  [facility] 03/20/2015 11:31 AM

## 2016-10-26 ENCOUNTER — Ambulatory Visit (INDEPENDENT_AMBULATORY_CARE_PROVIDER_SITE_OTHER): Payer: PRIVATE HEALTH INSURANCE | Admitting: Urgent Care

## 2016-10-26 VITALS — BP 106/84 | HR 67 | Temp 98.3°F | Resp 20 | Ht 73.5 in | Wt 176.4 lb

## 2016-10-26 DIAGNOSIS — R351 Nocturia: Secondary | ICD-10-CM

## 2016-10-26 DIAGNOSIS — Z0000 Encounter for general adult medical examination without abnormal findings: Secondary | ICD-10-CM | POA: Diagnosis not present

## 2016-10-26 LAB — CBC
HCT: 45.2 % (ref 38.5–50.0)
Hemoglobin: 15.3 g/dL (ref 13.2–17.1)
MCH: 31.1 pg (ref 27.0–33.0)
MCHC: 33.8 g/dL (ref 32.0–36.0)
MCV: 91.9 fL (ref 80.0–100.0)
MPV: 10.6 fL (ref 7.5–12.5)
Platelets: 250 10*3/uL (ref 140–400)
RBC: 4.92 MIL/uL (ref 4.20–5.80)
RDW: 13.4 % (ref 11.0–15.0)
WBC: 5.7 10*3/uL (ref 3.8–10.8)

## 2016-10-26 LAB — TSH: TSH: 1.66 mIU/L (ref 0.40–4.50)

## 2016-10-26 LAB — BASIC METABOLIC PANEL
BUN: 18 mg/dL (ref 7–25)
CO2: 20 mmol/L (ref 20–31)
Calcium: 9.3 mg/dL (ref 8.6–10.3)
Chloride: 107 mmol/L (ref 98–110)
Creat: 0.93 mg/dL (ref 0.70–1.33)
GLUCOSE: 77 mg/dL (ref 65–99)
POTASSIUM: 4.9 mmol/L (ref 3.5–5.3)
SODIUM: 140 mmol/L (ref 135–146)

## 2016-10-26 LAB — LIPID PANEL
CHOL/HDL RATIO: 7.7 ratio — AB (ref <5.0)
CHOLESTEROL: 199 mg/dL (ref <200)
HDL: 26 mg/dL — AB (ref >40)
Triglycerides: 926 mg/dL — ABNORMAL HIGH (ref <150)

## 2016-10-26 MED ORDER — TAMSULOSIN HCL 0.4 MG PO CAPS: 0.4000 mg | ORAL_CAPSULE | Freq: Every day | ORAL | 3 refills | Status: DC

## 2016-10-26 NOTE — Patient Instructions (Addendum)
Keeping you healthy  Get these tests  Blood pressure- Have your blood pressure checked once a year by your healthcare provider.  Normal blood pressure is 120/80  Weight- Have your body mass index (BMI) calculated to screen for obesity.  BMI is a measure of body fat based on height and weight. You can also calculate your own BMI at [email or web address]  Cholesterol- Have your cholesterol checked every year.  Diabetes- Have your blood sugar checked regularly if you have high blood pressure, high cholesterol, have a family history of diabetes or if you are overweight.  Screening for Colon Cancer- Colonoscopy starting at age 53.  Screening may begin sooner depending on your family history and other health conditions. Follow up colonoscopy as directed by your Gastroenterologist.  Screening for Prostate Cancer- Both blood work (PSA) and a rectal exam help screen for Prostate Cancer.  Screening begins at age 53 with African-American men and at age 53 with Caucasian men.  Screening may begin sooner depending on your family history.  Take these medicines  Aspirin- One aspirin daily can help prevent Heart disease and Stroke.  Flu shot- Every fall.  Tetanus- Every 10 years.  Zostavax- Once after the age of 53 to prevent Shingles.  Pneumonia shot- Once after the age of 53; if you are younger than 2865, ask your healthcare provider if you need a Pneumonia shot.  Take these steps  Don't smoke- If you do smoke, talk to your doctor about quitting.  For tips on how to quit, go to www.smokefree.gov or call 1-800-QUIT-NOW.  Be physically active- Exercise 5 days a week for at least 30 minutes.  If you are not already physically active start slow and gradually work up to 30 minutes of moderate physical activity.  Examples of moderate activity include walking briskly, mowing the yard, dancing, swimming, bicycling, etc.  Eat a healthy diet- Eat a variety of healthy food such as fruits, vegetables, low  fat milk, low fat cheese, yogurt, lean meant, poultry, fish, beans, tofu, etc. For more information go to www.thenutritionsource.org  Drink alcohol in moderation- Limit alcohol intake to less than two drinks a day. Never drink and drive.  Dentist- Brush and floss twice daily; visit your dentist twice a year.  Depression- Your emotional health is as important as your physical health. If you're feeling down, or losing interest in things you would normally enjoy please talk to your healthcare provider.  Eye exam- Visit your eye doctor every year.  Safe sex- If you may be exposed to a sexually transmitted infection, use a condom.  Seat belts- Seat belts can save your life; always wear one.  Smoke/Carbon Monoxide detectors- These detectors need to be installed on the appropriate level of your home.  Replace batteries at least once a year.  Skin cancer- When out in the sun, cover up and use sunscreen 15 SPF or higher.  Violence- If anyone is threatening you, please tell your healthcare provider. Living Will/ Health care power of attorney- Speak with your healthcare provider and family.    Tamsulosin capsules What is this medicine? TAMSULOSIN (tam SOO loe sin) is used to treat enlargement of the prostate gland in men, a condition called benign prostatic hyperplasia or BPH. It is not for use in women. It works by relaxing muscles in the prostate and bladder neck. This improves urine flow and reduces BPH symptoms. This medicine may be used for other purposes; ask your health care provider or pharmacist if you have questions. What should  I tell my health care provider before I take this medicine? They need to know if you have any of the following conditions: -advanced kidney disease -advanced liver disease -low blood pressure -prostate cancer -an unusual or allergic reaction to tamsulosin, sulfa drugs, other medicines, foods, dyes, or preservatives -pregnant or trying to get  pregnant -breast-feeding How should I use this medicine? Take this medicine by mouth about 30 minutes after the same meal every day. Follow the directions on the prescription label. Swallow the capsules whole with a glass of water. Do not crush, chew, or open capsules. Do not take your medicine more often than directed. Do not stop taking your medicine unless your doctor tells you to. Talk to your pediatrician regarding the use of this medicine in children. Special care may be needed. Overdosage: If you think you have taken too much of this medicine contact a poison control center or emergency room at once. NOTE: This medicine is only for you. Do not share this medicine with others. What if I miss a dose? If you miss a dose, take it as soon as you can. If it is almost time for your next dose, take only that dose. Do not take double or extra doses. If you stop taking your medicine for several days or more, ask your doctor or health care professional what dose you should start back on. What may interact with this medicine? -cimetidine -fluoxetine -ketoconazole -medicines for erectile disfunction like sildenafil, tadalafil, vardenafil -medicines for high blood pressure -other alpha-blockers like alfuzosin, doxazosin, phentolamine, phenoxybenzamine, prazosin, terazosin -warfarin This list may not describe all possible interactions. Give your health care provider a list of all the medicines, herbs, non-prescription drugs, or dietary supplements you use. Also tell them if you smoke, drink alcohol, or use illegal drugs. Some items may interact with your medicine. What should I watch for while using this medicine? Visit your doctor or health care professional for regular check ups. You will need lab work done before you start this medicine and regularly while you are taking it. Check your blood pressure as directed. Ask your health care professional what your blood pressure should be, and when you should  contact him or her. This medicine may make you feel dizzy or lightheaded. This is more likely to happen after the first dose, after an increase in dose, or during hot weather or exercise. Drinking alcohol and taking some medicines can make this worse. Do not drive, use machinery, or do anything that needs mental alertness until you know how this medicine affects you. Do not sit or stand up quickly. If you begin to feel dizzy, sit down until you feel better. These effects can decrease once your body adjusts to the medicine. Contact your doctor or health care professional right away if you have an erection that lasts longer than 4 hours or if it becomes painful. This may be a sign of a serious problem and must be treated right away to prevent permanent damage. If you are thinking of having cataract surgery, tell your eye surgeon that you have taken this medicine. What side effects may I notice from receiving this medicine? Side effects that you should report to your doctor or health care professional as soon as possible: -allergic reactions like skin rash or itching, hives, swelling of the lips, mouth, tongue, or throat -breathing problems -change in vision -feeling faint or lightheaded -irregular heartbeat -prolonged or painful erection -weakness Side effects that usually do not require medical attention (report  to your doctor or health care professional if they continue or are bothersome): -back pain -change in sex drive or performance -constipation, nausea or vomiting -cough -drowsy -runny or stuffy nose -trouble sleeping This list may not describe all possible side effects. Call your doctor for medical advice about side effects. You may report side effects to FDA at 1-800-FDA-1088. Where should I keep my medicine? Keep out of the reach of children. Store at room temperature between 15 and 30 degrees C (59 and 86 degrees F). Throw away any unused medicine after the expiration date. NOTE:  This sheet is a summary. It may not cover all possible information. If you have questions about this medicine, talk to your doctor, pharmacist, or health care provider.    2016, [employer]vier/Gold Standard. (2012-12-03 14:11:34)     IF you received an x-ray today, you will receive an invoice from [facility]. Please contact [facility] at [phone] with questions or concerns regarding your invoice.   IF you received labwork today, you will receive an invoice from [employer]. Please contact [facility] at [phone] with questions or concerns regarding your invoice.   Our billing staff will not be able to assist you with questions regarding bills from these companies.  You will be contacted with the lab results as soon as they are available. The fastest way to get your results is to activate your My Chart account. Instructions are located on the last page of this paperwork. If you have not heard from [place] regarding the results in 2 weeks, please contact this office.

## 2016-10-26 NOTE — Progress Notes (Signed)
MRN: [id number]  Subjective:   Mr. Ivan Blackwell is a 55 y.o. male presenting for annual physical exam.  Patient is married, has 2 children. He is doing an annual physical exam to continue having insurance from his wife. Denies smoking cigarettes or drinking alcohol. He refused social history stating that he does not trust the government will not access his medical record.   Medical care team includes: PCP: Pcp Not In System Vision: Uses reading glasses.  Specialists: None.   Nocturia - Reports several month history of worsening nocturia, straining. Has to wake up ~3 times per night, interrupts his sleep. Does not feel rested. Has not had his prostate checked. Denies ROS as below.  Health Maintenance: Colonoscopy was done 05/17/2014 and was normal. He is on 10 year follow up.   Rogen does not have any active problems on his problem list.   Ivan RuizJohn has a current medication list which includes the following prescription(s): multivitamin. He has No Known Allergies.  Galileo  has a past medical history of Allergy and Medical history non-contributory. Also  has a past surgical history that includes Anterior cervical decomp/discectomy fusion (N/A, 07/29/2013).  Denies family history of cancer, diabetes, HTN, HL, heart disease, stroke, mental illness.   Immunizations: Refuses immunizations.  Review of Systems  Constitutional: Negative for chills, diaphoresis, fever, malaise/fatigue and weight loss.  HENT: Negative for congestion, ear discharge, ear pain, hearing loss, nosebleeds, sore throat and tinnitus.   Eyes: Negative for blurred vision, double vision, photophobia, pain, discharge and redness.  Respiratory: Negative for cough, shortness of breath and wheezing.   Cardiovascular: Negative for chest pain, palpitations and leg swelling.  Gastrointestinal: Negative for abdominal pain, blood in stool, constipation, diarrhea, nausea and vomiting.  Genitourinary: Negative for dysuria, flank pain,  frequency, hematuria and urgency.  Musculoskeletal: Negative for back pain, joint pain and myalgias.  Skin: Negative for itching and rash.  Neurological: Negative for dizziness, tingling, seizures, loss of consciousness, weakness and headaches.  Endo/Heme/Allergies: Negative for polydipsia.  Psychiatric/Behavioral: Negative for depression, hallucinations, memory loss, substance abuse and suicidal ideas. The patient is not nervous/anxious and does not have insomnia.     Objective:   Vitals: BP 106/84 (BP Location: Right Arm, Patient Position: Sitting, Cuff Size: Large)   Pulse 67   Temp 98.3 F (36.8 C) (Oral)   Resp 20   Ht 6' 1.5" (1.867 m)   Wt 176 lb 6.4 oz (80 kg)   SpO2 99%   BMI 22.96 kg/m    Visual Acuity Screening   Right eye Left eye Both eyes  Without correction: 20/40 20/40 20/40   With correction:      Physical Exam  Constitutional: He is oriented to person, place, and time. He appears well-developed and well-nourished.  HENT:  TM's intact bilaterally, no effusions or erythema. Nasal turbinates pink and moist, nasal passages patent. No sinus tenderness. Oropharynx clear, mucous membranes moist, dentition in good repair.  Eyes: Conjunctivae and EOM are normal. Pupils are equal, round, and reactive to light. Right eye exhibits no discharge. Left eye exhibits no discharge. No scleral icterus.  Neck: Normal range of motion. Neck supple. No thyromegaly present.  Cardiovascular: Normal rate, regular rhythm and intact distal pulses.  Exam reveals no gallop and no friction rub.   No murmur heard. Pulmonary/Chest: No stridor. No respiratory distress. He has no wheezes. He has no rales.  Abdominal: Soft. Bowel sounds are normal. He exhibits no distension and no mass. There is no tenderness.  Musculoskeletal: Normal range of motion. He exhibits no edema or tenderness.  Lymphadenopathy:    He has no cervical adenopathy.  Neurological: He is alert and oriented to person, place,  and time. He has normal reflexes.  Skin: Skin is warm and dry. No rash noted. No erythema. No pallor.  Psychiatric: He has a normal mood and affect.   Assessment and Plan :   1. Annual physical exam - Medically stable. Patient was very agitated discussing healthcare, very distrusting of medications, providers in general. I was able to discuss our intentions with annual physical, ended the annual physical exam amicably. - CBC - Basic metabolic panel - Lipid panel - TSH  2. Nocturia - Refused DRE, prostate check. He will consider starting Flomax. Labs pending, f/u in 4 weeks.  Ivan BambergMario Jasara Corrigan, PA-C [facility] [phone] 10/26/2016  11:30 AM

## 2016-10-27 LAB — PSA: PSA: 0.4 ng/mL (ref <=4.0)

## 2016-10-30 ENCOUNTER — Encounter: Payer: Self-pay | Admitting: Urgent Care

## 2016-10-30 ENCOUNTER — Other Ambulatory Visit: Payer: Self-pay | Admitting: Urgent Care

## 2016-10-30 MED ORDER — FENOFIBRATE 54 MG PO TABS: 54.0000 mg | ORAL_TABLET | Freq: Every day | ORAL | 3 refills | Status: DC

## 2017-08-07 ENCOUNTER — Ambulatory Visit (INDEPENDENT_AMBULATORY_CARE_PROVIDER_SITE_OTHER): Payer: PRIVATE HEALTH INSURANCE | Admitting: Urgent Care

## 2017-08-07 ENCOUNTER — Encounter: Payer: Self-pay | Admitting: Urgent Care

## 2017-08-07 VITALS — BP 117/80 | HR 78 | Temp 97.9°F | Resp 17 | Ht 73.5 in | Wt 179.2 lb

## 2017-08-07 DIAGNOSIS — H1131 Conjunctival hemorrhage, right eye: Secondary | ICD-10-CM | POA: Diagnosis not present

## 2017-08-07 DIAGNOSIS — E782 Mixed hyperlipidemia: Secondary | ICD-10-CM

## 2017-08-07 DIAGNOSIS — H578 Other specified disorders of eye and adnexa: Secondary | ICD-10-CM | POA: Diagnosis not present

## 2017-08-07 DIAGNOSIS — H5789 Other specified disorders of eye and adnexa: Secondary | ICD-10-CM

## 2017-08-07 MED ORDER — ATORVASTATIN CALCIUM 80 MG PO TABS: 80.0000 mg | ORAL_TABLET | Freq: Every day | ORAL | 3 refills | Status: DC

## 2017-08-07 MED ORDER — ATORVASTATIN CALCIUM 40 MG PO TABS: 40.0000 mg | ORAL_TABLET | Freq: Every day | ORAL | 0 refills | Status: DC

## 2017-08-07 NOTE — Patient Instructions (Addendum)
Subconjunctival Hemorrhage Subconjunctival hemorrhage is bleeding that happens between the white part of your eye (sclera) and the clear membrane that covers the outside of your eye (conjunctiva). There are many tiny blood vessels near the surface of your eye. A subconjunctival hemorrhage happens when one or more of these vessels breaks and bleeds, causing a red patch to appear on your eye. This is similar to a bruise. Depending on the amount of bleeding, the red patch may only cover a small area of your eye or it may cover the entire visible part of the sclera. If a lot of blood collects under the conjunctiva, there may also be swelling. Subconjunctival hemorrhages do not affect your vision or cause pain, but your eye may feel irritated if there is swelling. Subconjunctival hemorrhages usually do not require treatment, and they disappear on their own within two weeks. What are the causes? This condition may be caused by:  Mild trauma, such as rubbing your eye too hard.  Severe trauma or blunt injuries.  Coughing, sneezing, or vomiting.  Straining, such as when lifting a heavy object.  High blood pressure.  Recent eye surgery.  A history of diabetes.  Certain medicines, especially blood thinners (anticoagulants).  Other conditions, such as eye tumors, bleeding disorders, or blood vessel abnormalities.  Subconjunctival hemorrhages can happen without an obvious cause. What are the signs or symptoms? Symptoms of this condition include:  A bright red or dark red patch on the white part of the eye. ? The red area may spread out to cover a larger area of the eye before it goes away. ? The red area may turn brownish-yellow before it goes away.  Swelling.  Mild eye irritation.  How is this diagnosed? This condition is diagnosed with a physical exam. If your subconjunctival hemorrhage was caused by trauma, your health care provider may refer you to an eye specialist (ophthalmologist) or  another specialist to check for other injuries. You may have other tests, including:  An eye exam.  A blood pressure check.  Blood tests to check for bleeding disorders.  If your subconjunctival hemorrhage was caused by trauma, X-rays or a CT scan may be done to check for other injuries. How is this treated? Usually, no treatment is needed. Your health care provider may recommend eye drops or cold compresses to help with discomfort. Follow these instructions at home:  Take over-the-counter and prescription medicines only as directed by your health care provider.  Use eye drops or cold compresses to help with discomfort as directed by your health care provider.  Avoid activities, things, and environments that may irritate or injure your eye.  Keep all follow-up visits as told by your health care provider. This is important. Contact a health care provider if:  You have pain in your eye.  The bleeding does not go away within 3 weeks.  You keep getting new subconjunctival hemorrhages. Get help right away if:  Your vision changes or you have difficulty seeing.  You suddenly develop severe sensitivity to light.  You develop a severe headache, persistent vomiting, confusion, or abnormal tiredness (lethargy).  Your eye seems to bulge or protrude from your eye socket.  You develop unexplained bruises on your body.  You have unexplained bleeding in another area of your body. This information is not intended to replace advice given to you by your health care provider. Make sure you discuss any questions you have with your health care provider. Document Released: 12/03/2005 Document Revised: 07/29/2016 Document  Reviewed: 02/09/2015 Elsevier Interactive Patient Education  2018 [employer].    Food Choices to Lower Your Triglycerides Triglycerides are a type of fat in your blood. High levels of triglycerides can increase the risk of heart disease and stroke. If your triglyceride  levels are high, the foods you eat and your eating habits are very important. Choosing the right foods can help lower your triglycerides. What general guidelines do I need to follow?  Lose weight if you are overweight.  Limit or avoid alcohol.  Fill one half of your plate with vegetables and green salads.  Limit fruit to two servings a day. Choose fruit instead of juice.  Make one fourth of your plate whole grains. Look for the word "whole" as the first word in the ingredient list.  Fill one fourth of your plate with lean protein foods.  Enjoy fatty fish (such as salmon, mackerel, sardines, and tuna) three times a week.  Choose healthy fats.  Limit foods high in starch and sugar.  Eat more home-cooked food and less restaurant, buffet, and fast food.  Limit fried foods.  Cook foods using methods other than frying.  Limit saturated fats.  Check ingredient lists to avoid foods with partially hydrogenated oils (trans fats) in them. What foods can I eat? Grains Whole grains, such as whole wheat or whole grain breads, crackers, cereals, and pasta. Unsweetened oatmeal, bulgur, barley, quinoa, or brown rice. Corn or whole wheat flour tortillas. Vegetables Fresh or frozen vegetables (raw, steamed, roasted, or grilled). Green salads. Fruits All fresh, canned (in natural juice), or frozen fruits. Meat and Other Protein Products Ground beef (85% or leaner), grass-fed beef, or beef trimmed of fat. Skinless chicken or [place]. Ground chicken or [place]. Pork trimmed of fat. All fish and seafood. Eggs. Dried beans, peas, or lentils. Unsalted nuts or seeds. Unsalted canned or dry beans. Dairy Low-fat dairy products, such as skim or 1% milk, 2% or reduced-fat cheeses, low-fat ricotta or cottage cheese, or plain low-fat yogurt. Fats and Oils Tub margarines without trans fats. Light or reduced-fat mayonnaise and salad dressings. Avocado. Safflower, olive, or canola oils. Natural peanut or almond  butter. The items listed above may not be a complete list of recommended foods or beverages. Contact your dietitian for more options. What foods are not recommended? Grains White bread. White pasta. White rice. Cornbread. Bagels, pastries, and croissants. Crackers that contain trans fat. Vegetables White potatoes. Corn. Creamed or fried vegetables. Vegetables in a cheese sauce. Fruits Dried fruits. Canned fruit in light or heavy syrup. Fruit juice. Meat and Other Protein Products Fatty cuts of meat. Ribs, chicken wings, bacon, sausage, bologna, salami, chitterlings, fatback, hot dogs, bratwurst, and packaged luncheon meats. Dairy Whole or 2% milk, cream, half-and-half, and cream cheese. Whole-fat or sweetened yogurt. Full-fat cheeses. Nondairy creamers and whipped toppings. Processed cheese, cheese spreads, or cheese curds. Sweets and Desserts Corn syrup, sugars, honey, and molasses. Candy. Jam and jelly. Syrup. Sweetened cereals. Cookies, pies, cakes, donuts, muffins, and ice cream. Fats and Oils Butter, stick margarine, lard, shortening, ghee, or bacon fat. Coconut, palm kernel, or palm oils. Beverages Alcohol. Sweetened drinks (such as sodas, lemonade, and fruit drinks or punches). The items listed above may not be a complete list of foods and beverages to avoid. Contact your dietitian for more information. This information is not intended to replace advice given to you by your health care provider. Make sure you discuss any questions you have with your health care provider. Document Released: 09/20/2004  Document Revised: 05/10/2016 Document Reviewed: 10/07/2013 Elsevier Interactive Patient Education  2017 Elsevier Inc.    Atorvastatin tablets What is this medicine? ATORVASTATIN (a TORE va sta tin) is known as a HMG-CoA reductase inhibitor or 'statin'. It lowers the level of cholesterol and triglycerides in the blood. This drug may also reduce the risk of heart attack, stroke, or  other health problems in patients with risk factors for heart disease. Diet and lifestyle changes are often used with this drug. This medicine may be used for other purposes; ask your health care provider or pharmacist if you have questions. COMMON BRAND NAME(S): Lipitor What should I tell my health care provider before I take this medicine? They need to know if you have any of these conditions: -frequently drink alcoholic beverages -history of stroke, TIA -kidney disease -liver disease -muscle aches or weakness -other medical condition -an unusual or allergic reaction to atorvastatin, other medicines, foods, dyes, or preservatives -pregnant or trying to get pregnant -breast-feeding How should I use this medicine? Take this medicine by mouth with a glass of water. Follow the directions on the prescription label. You can take this medicine with or without food. Take your doses at regular intervals. Do not take your medicine more often than directed. Talk to your pediatrician regarding the use of this medicine in children. While this drug may be prescribed for children as young as 35 years old for selected conditions, precautions do apply. Overdosage: If you think you have taken too much of this medicine contact a poison control center or emergency room at once. NOTE: This medicine is only for you. Do not share this medicine with others. What if I miss a dose? If you miss a dose, take it as soon as you can. If it is almost time for your next dose, take only that dose. Do not take double or extra doses. What may interact with this medicine? Do not take this medicine with any of the following medications: -red yeast rice -telaprevir -telithromycin -voriconazole This medicine may also interact with the following medications: -alcohol -antiviral medicines for HIV or AIDS -boceprevir -certain antibiotics like clarithromycin, erythromycin, troleandomycin -certain medicines for cholesterol  like fenofibrate or gemfibrozil -cimetidine -clarithromycin -colchicine -cyclosporine -digoxin -male hormones, like estrogens or progestins and birth control pills -grapefruit juice -medicines for fungal infections like fluconazole, itraconazole, ketoconazole -niacin -rifampin -spironolactone This list may not describe all possible interactions. Give your health care provider a list of all the medicines, herbs, non-prescription drugs, or dietary supplements you use. Also tell them if you smoke, drink alcohol, or use illegal drugs. Some items may interact with your medicine. What should I watch for while using this medicine? Visit your doctor or health care professional for regular check-ups. You may need regular tests to make sure your liver is working properly. Tell your doctor or health care professional right away if you get any unexplained muscle pain, tenderness, or weakness, especially if you also have a fever and tiredness. Your doctor or health care professional may tell you to stop taking this medicine if you develop muscle problems. If your muscle problems do not go away after stopping this medicine, contact your health care professional. This drug is only part of a total heart-health program. Your doctor or a dietician can suggest a low-cholesterol and low-fat diet to help. Avoid alcohol and smoking, and keep a proper exercise schedule. Do not use this drug if you are pregnant or breast-feeding. Serious side effects to an unborn child or  to an infant are possible. Talk to your doctor or pharmacist for more information. This medicine may affect blood sugar levels. If you have diabetes, check with your doctor or health care professional before you change your diet or the dose of your diabetic medicine. If you are going to have surgery tell your health care professional that you are taking this drug. What side effects may I notice from receiving this medicine? Side effects that you  should report to your doctor or health care professional as soon as possible: -allergic reactions like skin rash, itching or hives, swelling of the face, lips, or tongue -dark urine -fever -joint pain -muscle cramps, pain -redness, blistering, peeling or loosening of the skin, including inside the mouth -trouble passing urine or change in the amount of urine -unusually weak or tired -yellowing of eyes or skin Side effects that usually do not require medical attention (report to your doctor or health care professional if they continue or are bothersome): -constipation -heartburn -stomach gas, pain, upset This list may not describe all possible side effects. Call your doctor for medical advice about side effects. You may report side effects to FDA at 1-800-FDA-1088. Where should I keep my medicine? Keep out of the reach of children. Store at room temperature between 20 to 25 degrees C (68 to 77 degrees F). Throw away any unused medicine after the expiration date. NOTE: This sheet is a summary. It may not cover all possible information. If you have questions about this medicine, talk to your doctor, pharmacist, or health care provider.  2018 Elsevier/Gold Standard (2011-10-23 16:10:96)

## 2017-08-07 NOTE — Progress Notes (Signed)
  MRN: [id number] DOB: [birth year]  Subjective:   Valin Hartsel is a 56 y.o. male presenting for chief complaint of red eyes (right eye, onset x yesterday.  No pain or discomfort and per pt is able to see fine.)  Reports onset of redness in right eye yesterday. Denies eye pain, eye swelling, trauma, drainage, photosensitivity, headache, sinus congestion, sinus pain, fever. He presents with his wife today who voices concerns over patient not being willing to take medications for his mixed HL. His last check was 10/2016, had hypertriglyceridemia, low HDL, LDL was not calculated due to hypertriglyceridemia. Patient has only taken 1 pill of fenofibrate yesterday. Denies smoking cigarettes.  Knoah has a current medication list which includes the following prescription(s): multivitamin and tamsulosin. Also has No Known Allergies.  Ashtan  has a past medical history of Allergy and Medical history non-contributory. Also  has a past surgical history that includes Anterior cervical decomp/discectomy fusion (N/A, 07/29/2013).  Objective:   Vitals: BP 117/80 (BP Location: Right Arm, Patient Position: Sitting, Cuff Size: Normal)   Pulse 78   Temp 97.9 F (36.6 C) (Oral)   Resp 17   Ht 6' 1.5" (1.867 m)   Wt 179 lb 3.2 oz (81.3 kg)   SpO2 96%   BMI 23.32 kg/m    Visual Acuity Screening   Right eye Left eye Both eyes  Without correction:     With correction: 20/20 20/20 20/20     Physical Exam  Constitutional: He is oriented to person, place, and time. He appears well-developed and well-nourished.  Eyes: Pupils are equal, round, and reactive to light. EOM are normal. Right eye exhibits no chemosis, no discharge, no exudate and no hordeolum. No foreign body present in the right eye. Left eye exhibits no chemosis, no discharge, no exudate and no hordeolum. No foreign body present in the left eye. Right conjunctiva is not injected. Right conjunctiva has a hemorrhage (subconjunctival over area depicted). Left  conjunctiva is not injected. Left conjunctiva has no hemorrhage. No scleral icterus.    Cardiovascular: Normal rate.   Pulmonary/Chest: Effort normal.  Neurological: He is alert and oriented to person, place, and time.  Psychiatric: His affect is not angry and not labile. He is not aggressive.  Pressured speech and is ornery, persistently voices distrust of medical system and government.   Assessment and Plan :   1. Subconjunctival hemorrhage of right eye 2. Redness of right eye - Anticipatory guidance provided. Return-to-clinic precautions discussed, patient verbalized understanding.   3. Mixed hyperlipidemia - Counseled patient on lifestyle modifications. Start atorvastatin 40mg  for 4 weeks. Then increase to 80mg  thereafter. Recheck in 6 months. Counseled patient on potential for adverse effects with medications prescribed today, patient verbalized understanding.   Wallis Bamberg, PA-C Primary [facility] at [facility] [phone] 08/07/2017  12:12 PM

## 2017-09-29 ENCOUNTER — Other Ambulatory Visit: Payer: Self-pay | Admitting: Urgent Care

## 2017-10-31 ENCOUNTER — Encounter: Payer: PRIVATE HEALTH INSURANCE | Admitting: Urgent Care

## 2017-11-04 ENCOUNTER — Encounter: Payer: Self-pay | Admitting: Family Medicine

## 2017-11-04 ENCOUNTER — Ambulatory Visit: Payer: PRIVATE HEALTH INSURANCE | Admitting: Family Medicine

## 2017-11-04 ENCOUNTER — Ambulatory Visit (INDEPENDENT_AMBULATORY_CARE_PROVIDER_SITE_OTHER): Payer: PRIVATE HEALTH INSURANCE

## 2017-11-04 ENCOUNTER — Other Ambulatory Visit: Payer: Self-pay

## 2017-11-04 VITALS — HR 80 | Temp 100.1°F | Resp 16 | Ht 73.5 in | Wt 179.2 lb

## 2017-11-04 DIAGNOSIS — J181 Lobar pneumonia, unspecified organism: Secondary | ICD-10-CM

## 2017-11-04 DIAGNOSIS — J189 Pneumonia, unspecified organism: Secondary | ICD-10-CM

## 2017-11-04 DIAGNOSIS — R509 Fever, unspecified: Secondary | ICD-10-CM

## 2017-11-04 DIAGNOSIS — R05 Cough: Secondary | ICD-10-CM

## 2017-11-04 DIAGNOSIS — R059 Cough, unspecified: Secondary | ICD-10-CM

## 2017-11-04 LAB — POCT CBC
GRANULOCYTE PERCENT: 75.2 % (ref 37–80)
HCT, POC: 36.3 % — AB (ref 43.5–53.7)
Hemoglobin: 12.2 g/dL — AB (ref 14.1–18.1)
Lymph, poc: 1.9 (ref 0.6–3.4)
MCH: 29.4 pg (ref 27–31.2)
MCHC: 33.5 g/dL (ref 31.8–35.4)
MCV: 87.7 fL (ref 80–97)
MID (CBC): 0.8 (ref 0–0.9)
MPV: 6.9 fL (ref 0–99.8)
PLATELET COUNT, POC: 345 10*3/uL (ref 142–424)
POC Granulocyte: 8.1 — AB (ref 2–6.9)
POC LYMPH PERCENT: 17.6 %L (ref 10–50)
POC MID %: 7.2 %M (ref 0–12)
RBC: 4.14 M/uL — AB (ref 4.69–6.13)
RDW, POC: 12 %
WBC: 10.8 10*3/uL — AB (ref 4.6–10.2)

## 2017-11-04 LAB — POCT INFLUENZA A/B
Influenza A, POC: NEGATIVE
Influenza B, POC: NEGATIVE

## 2017-11-04 MED ORDER — AZITHROMYCIN 250 MG PO TABS: ORAL_TABLET | ORAL | 0 refills | Status: DC

## 2017-11-04 MED ORDER — HYDROCODONE-HOMATROPINE 5-1.5 MG/5ML PO SYRP: ORAL_SOLUTION | ORAL | 0 refills | Status: DC

## 2017-11-04 MED ORDER — CEFTRIAXONE SODIUM 1 G IJ SOLR
1.0000 g | Freq: Once | INTRAMUSCULAR | Status: AC
  Administered 2017-11-04: 1 g via INTRAMUSCULAR

## 2017-11-04 NOTE — Patient Instructions (Addendum)
X-ray does indicate pneumonia. Antibiotic injection was given in the office, but also start the azithromycin, 2 pills tonight, then 1 pill per day for the next 5 days. Mucinex over-the-counter as needed for cough, hydrocodone cough syrup if needed at night. Tylenol or ibuprofen as needed for fever and body aches, but make sure you also drink plenty of fluids. Follow-up in 2 days for recheck, sooner or to the emergency room if any worsening symptoms.   Community-Acquired Pneumonia, Adult Pneumonia is an infection of the lungs. One type of pneumonia can happen while a person is in a hospital. A different type can happen when a person is not in a hospital (community-acquired pneumonia). It is easy for this kind to spread from person to person. It can spread to you if you breathe near an infected person who coughs or sneezes. Some symptoms include:  A dry cough.  A wet (productive) cough.  Fever.  Sweating.  Chest pain.  Follow these instructions at home:  Take over-the-counter and prescription medicines only as told by your doctor. ? Only take cough medicine if you are losing sleep. ? If you were prescribed an antibiotic medicine, take it as told by your doctor. Do not stop taking the antibiotic even if you start to feel better.  Sleep with your head and neck raised (elevated). You can do this by putting a few pillows under your head, or you can sleep in a recliner.  Do not use tobacco products. These include cigarettes, chewing tobacco, and e-cigarettes. If you need help quitting, ask your doctor.  Drink enough water to keep your pee (urine) clear or pale yellow. A shot (vaccine) can help prevent pneumonia. Shots are often suggested for:  People older than 54 years of age.  People older than 54 years of age: ? Who are having cancer treatment. ? Who have long-term (chronic) lung disease. ? Who have problems with their body's defense system (immune system).  You may also prevent  pneumonia if you take these actions:  Get the flu (influenza) shot every year.  Go to the dentist as often as told.  Wash your hands often. If soap and water are not available, use hand sanitizer.  Contact a doctor if:  You have a fever.  You lose sleep because your cough medicine does not help. Get help right away if:  You are short of breath and it gets worse.  You have more chest pain.  Your sickness gets worse. This is very serious if: ? You are an older adult. ? Your body's defense system is weak.  You cough up blood. This information is not intended to replace advice given to you by your health care provider. Make sure you discuss any questions you have with your health care provider. Document Released: 05/21/2008 Document Revised: 05/10/2016 Document Reviewed: 03/30/2015 Elsevier Interactive Patient Education  2018 [employer]    IF you received an x-ray today, you will receive an invoice from [facility]. Please contact [facility] at [phone] with questions or concerns regarding your invoice.   IF you received labwork today, you will receive an invoice from [facility]. Please contact [facility] at [phone] with questions or concerns regarding your invoice.   Our billing staff will not be able to assist you with questions regarding bills from these companies.  You will be contacted with the lab results as soon as they are available. The fastest way to get your results is to activate your My Chart account. Instructions are  located on the last page of this paperwork. If you have not heard from [place] regarding the results in 2 weeks, please contact this office.

## 2017-11-04 NOTE — Progress Notes (Addendum)
Subjective:  By signing my name below, I, Stann Oresung-Kai Tsai, attest that this documentation has been prepared under the direction and in the presence of Meredith StaggersJeffrey Latorria Zeoli, MD. Electronically Signed: Stann Oresung-Kai Tsai, Scribe. 11/04/2017 , 5:22 PM .  Patient was seen in Room 12 .   Patient ID: Ivan SeverJohn Blackwell, male    DOB: 04/03/1961, 56 y.o.   MRN: [id number] Chief Complaint  Patient presents with  . Flu-like symptoms    patient presents with intermittant fever, productive cough, and chills x 1 week. Patient also c/o loss of appetite   HPI Ivan Blackwell is a 56 y.o. male  Here for intermittent fever, productive cough, chills and loss of appetite that started a week ago. Patient states he's been feeling ill for about a week with intermittent hot and cold spells. He also reports a hacking cough that causes his head to hurt. He notes having loss of appetite, and trouble falling asleep and wakes up periodically. He's been coughing up discolored mucus (yellowish green). He's been taking mucinex, robitussin and tylenol-fever reducer. He denies any urinary symptoms. He denies any known drug allergies.   He is a former smoker, quit 11 years ago. He rarely has alcoholic beverage.   Patient Active Problem List   Diagnosis Date Noted  . Subconjunctival hemorrhage of right eye 08/07/2017  . Mixed hyperlipidemia 08/07/2017   Past Medical History:  Diagnosis Date  . Allergy    SEASONAL  . Medical history non-contributory    Past Surgical History:  Procedure Laterality Date  . ANTERIOR CERVICAL DECOMPRESSION/DISCECTOMY FUSION PLATING BONEGRAFT CERVICAL SIX-SEVEN N/A 07/29/2013   Performed by Coletta Memosabbell, Kyle, [facility]   No Known Allergies Prior to Admission medications   Medication Sig Start Date End Date Taking? Authorizing Provider  atorvastatin (LIPITOR) 40 MG tablet Take 1 tablet (40 mg total) by mouth daily. 08/07/17   Wallis BambergMani, Mario, PA-C  atorvastatin (LIPITOR) 80 MG tablet Take 1 tablet (80 mg  total) by mouth daily. Start this medication after you finish the 40mg  tablets in 1 month. 08/07/17   Wallis BambergMani, Mario, PA-C  Multiple Vitamin (MULTIVITAMIN) tablet Take 1 tablet by mouth daily.    [provider]  tamsulosin (FLOMAX) 0.4 MG CAPS capsule Take 1 capsule (0.4 mg total) by mouth daily. 10/26/16   Wallis BambergMani, Mario, PA-C   Social History   Socioeconomic History  . Marital status: Married    Spouse name: Not on file  . Number of children: Not on file  . Years of education: Not on file  . Highest education level: Not on file  Social Needs  . Financial resource strain: Not on file  . Food insecurity - worry: Not on file  . Food insecurity - inability: Not on file  . Transportation needs - medical: Not on file  . Transportation needs - non-medical: Not on file  Occupational History  . Not on file  Tobacco Use  . Smoking status: Former Smoker    Packs/day: 1.00    Years: 20.00    Pack years: 20.00    Types: Cigarettes    Last attempt to quit: 12/17/2005    Years since quitting: 11.8  . Smokeless tobacco: Never Used  . Tobacco comment: Quit  Aug 2007  Substance and Sexual Activity  . Alcohol use: Yes    Comment: occ. "during football season"  2-3 drinks on Sunday per pt.   . Drug use: No  . Sexual activity: Yes    Birth control/protection: None  Other Topics Concern  . Not on file  Social History Narrative  . Not on file   Review of Systems  Constitutional: Positive for appetite change, chills and fever. Negative for fatigue and unexpected weight change.  Eyes: Negative for visual disturbance.  Respiratory: Positive for cough. Negative for chest tightness and shortness of breath.   Cardiovascular: Negative for chest pain, palpitations and leg swelling.  Gastrointestinal: Negative for abdominal pain and blood in stool.  Neurological: Negative for dizziness, light-headedness and headaches.  Psychiatric/Behavioral: Positive for sleep disturbance.       Objective:    Physical Exam  Constitutional: He is oriented to person, place, and time. He appears well-developed and well-nourished. No distress.  HENT:  Head: Normocephalic and atraumatic.  Eyes: EOM are normal. Pupils are equal, round, and reactive to light.  Neck: Neck supple.  Cardiovascular: Normal rate.  Pulmonary/Chest: Effort normal. No respiratory distress. He has decreased breath sounds (slight) in the right lower field. He has rhonchi in the right lower field.  Musculoskeletal: Normal range of motion.  Neurological: He is alert and oriented to person, place, and time.  Skin: Skin is warm and dry.  Psychiatric: He has a normal mood and affect. His behavior is normal.  Nursing note and vitals reviewed.   Vitals:   11/04/17 1649  Pulse: 80  Resp: 16  Temp: 100.1 F (37.8 C)  TempSrc: Oral  SpO2: 94%  Weight: 179 lb 3.2 oz (81.3 kg)  Height: 6' 1.5" (1.867 m)   Results for orders placed or performed in visit on 11/04/17  POCT Influenza A/B  Result Value Ref Range   Influenza A, POC Negative Negative   Influenza B, POC Negative Negative  POCT CBC  Result Value Ref Range   WBC 10.8 (A) 4.6 - 10.2 K/uL   Lymph, poc 1.9 0.6 - 3.4   POC LYMPH PERCENT 17.6 10 - 50 %L   MID (cbc) 0.8 0 - 0.9   POC MID % 7.2 0 - 12 %M   POC Granulocyte 8.1 (A) 2 - 6.9   Granulocyte percent 75.2 37 - 80 %G   RBC 4.14 (A) 4.69 - 6.13 M/uL   Hemoglobin 12.2 (A) 14.1 - 18.1 g/dL   HCT, POC 16.1 (A) 09.6 - 53.7 %   MCV 87.7 80 - 97 fL   MCH, POC 29.4 27 - 31.2 pg   MCHC 33.5 31.8 - 35.4 g/dL   RDW, POC 04.5 %   Platelet Count, POC 345 142 - 424 K/uL   MPV 6.9 0 - 99.8 fL   Dg Chest 2 View  Result Date: 11/04/2017 CLINICAL DATA:  Cough, fever, rhonchi and LEFT lower lobe question infiltrate EXAM: CHEST  2 VIEW COMPARISON:  None FINDINGS: Normal heart size, mediastinal contours, and pulmonary vascularity. Central peribronchial thickening question mild underlying hyperinflation. LEFT lower lobe opacity  likely represents pneumonia with coexistent atelectasis. Remaining lungs clear. No pleural effusion or pneumothorax. Bones unremarkable. IMPRESSION: Bronchitic changes with suspected LEFT lower lobe pneumonia and mild LEFT basilar atelectasis. Electronically Signed   By: Ulyses Southward M.D.   On: 11/04/2017 17:51        Assessment & Plan:   Ivan Blackwell is a 56 y.o. male Pneumonia of left lower lobe due to infectious organism (HCC) - Plan: azithromycin (ZITHROMAX) 250 MG tablet, cefTRIAXone (ROCEPHIN) injection 1 g  Fever, unspecified - Plan: POCT Influenza A/B, POCT CBC, DG Chest 2 View, azithromycin (ZITHROMAX) 250 MG tablet  Cough - Plan:  POCT CBC, DG Chest 2 View, azithromycin (ZITHROMAX) 250 MG tablet  Left lower lobe pneumonia, keeping acquired pneumonia. No known comorbidities.  -Rocephin 1 g IM, start azithromycin, Z-Pak.   -Mucinex or hydrocodone cough syrup if needed at night for symptomatic care. Antipyretics discussed and recheck in 48 hours, RTC/ER precautions given. Understanding expressed  Meds ordered this encounter  Medications  . azithromycin (ZITHROMAX) 250 MG tablet    Sig: Take 2 pills by mouth on day 1, then 1 pill by mouth per day on days 2 through 5.    Dispense:  6 tablet    Refill:  0  . HYDROcodone-homatropine (HYCODAN) 5-1.5 MG/5ML syrup    Sig: 1577m by mouth a bedtime as needed for cough.    Dispense:  120 mL    Refill:  0  . cefTRIAXone (ROCEPHIN) injection 1 g   Patient Instructions   X-ray does indicate pneumonia. Antibiotic injection was given in the office, but also start the azithromycin, 2 pills tonight, then 1 pill per day for the next 5 days. Mucinex over-the-counter as needed for cough, hydrocodone cough syrup if needed at night. Tylenol or ibuprofen as needed for fever and body aches, but make sure you also drink plenty of fluids. Follow-up in 2 days for recheck, sooner or to the emergency room if any worsening symptoms.   Community-Acquired  Pneumonia, Adult Pneumonia is an infection of the lungs. One type of pneumonia can happen while a person is in a hospital. A different type can happen when a person is not in a hospital (community-acquired pneumonia). It is easy for this kind to spread from person to person. It can spread to you if you breathe near an infected person who coughs or sneezes. Some symptoms include:  A dry cough.  A wet (productive) cough.  Fever.  Sweating.  Chest pain.  Follow these instructions at home:  Take over-the-counter and prescription medicines only as told by your doctor. ? Only take cough medicine if you are losing sleep. ? If you were prescribed an antibiotic medicine, take it as told by your doctor. Do not stop taking the antibiotic even if you start to feel better.  Sleep with your head and neck raised (elevated). You can do this by putting a few pillows under your head, or you can sleep in a recliner.  Do not use tobacco products. These include cigarettes, chewing tobacco, and e-cigarettes. If you need help quitting, ask your doctor.  Drink enough water to keep your pee (urine) clear or pale yellow. A shot (vaccine) can help prevent pneumonia. Shots are often suggested for:  People older than 54 years of age.  People older than 54 years of age: ? Who are having cancer treatment. ? Who have long-term (chronic) lung disease. ? Who have problems with their body's defense system (immune system).  You may also prevent pneumonia if you take these actions:  Get the flu (influenza) shot every year.  Go to the dentist as often as told.  Wash your hands often. If soap and water are not available, use hand sanitizer.  Contact a doctor if:  You have a fever.  You lose sleep because your cough medicine does not help. Get help right away if:  You are short of breath and it gets worse.  You have more chest pain.  Your sickness gets worse. This is very serious if: ? You are an older  adult. ? Your body's defense system is weak.  You cough  up blood. This information is not intended to replace advice given to you by your health care provider. Make sure you discuss any questions you have with your health care provider. Document Released: 05/21/2008 Document Revised: 05/10/2016 Document Reviewed: 03/30/2015 Elsevier Interactive Patient Education  2018 [employer].    IF you received an x-ray today, you will receive an invoice from [facility]. Please contact [facility] at [phone] with questions or concerns regarding your invoice.   IF you received labwork today, you will receive an invoice from [facility]. Please contact [facility] at [phone] with questions or concerns regarding your invoice.   Our billing staff will not be able to assist you with questions regarding bills from these companies.  You will be contacted with the lab results as soon as they are available. The fastest way to get your results is to activate your My Chart account. Instructions are located on the last page of this paperwork. If you have not heard from [place] regarding the results in 2 weeks, please contact this office.      I personally performed the services described in this documentation, which was scribed in my presence. The recorded information has been reviewed and considered for accuracy and completeness, addended by me as needed, and agree with information above.  Signed,   Meredith Staggers, MD Primary Care at [facility].  11/04/17 6:25 PM

## 2017-11-06 ENCOUNTER — Encounter: Payer: Self-pay | Admitting: Physician Assistant

## 2017-11-06 ENCOUNTER — Other Ambulatory Visit: Payer: Self-pay

## 2017-11-06 ENCOUNTER — Ambulatory Visit (INDEPENDENT_AMBULATORY_CARE_PROVIDER_SITE_OTHER): Payer: PRIVATE HEALTH INSURANCE | Admitting: Physician Assistant

## 2017-11-06 VITALS — BP 110/76 | HR 69 | Temp 98.1°F | Resp 16 | Ht 73.5 in | Wt 172.0 lb

## 2017-11-06 DIAGNOSIS — J181 Lobar pneumonia, unspecified organism: Secondary | ICD-10-CM | POA: Diagnosis not present

## 2017-11-06 DIAGNOSIS — J189 Pneumonia, unspecified organism: Secondary | ICD-10-CM

## 2017-11-06 NOTE — Patient Instructions (Addendum)
Please continue the antibiotic at this time.   Please try to get a generic form of mucinex (guafenesin).  This can help thin out the mucus as you said.  If not, make sure you hydrate well with water.     IF you received an x-ray today, you will receive an invoice from [facility]. Please contact [facility] at [phone] with questions or concerns regarding your invoice.   IF you received labwork today, you will receive an invoice from [facility]. Please contact [facility] at [phone] with questions or concerns regarding your invoice.   Our billing staff will not be able to assist you with questions regarding bills from these companies.  You will be contacted with the lab results as soon as they are available. The fastest way to get your results is to activate your My Chart account. Instructions are located on the last page of this paperwork. If you have not heard from us regarding the results in 2 weeks, please contact this office.

## 2017-11-06 NOTE — Progress Notes (Signed)
[facility] [address], [place] [address] [phone]  Date:  11/06/2017   Name:  Ivan Blackwell   DOB:  Mar 22, 1961   MRN:  [id number]  PCP:  System, Pcp Not In    History of Present Illness:  Ivan Blackwell is a 56 y.o. male patient who presents to PCP with  Chief Complaint  Patient presents with  . Follow-up    Pneumonia     Patient voices that he is taking the antibiotic compliantly.  He states, "I don't know if I am feeling better.  I have not been outside."  However, he states that he transported here without any difficulty thus far.  He states that he has no fever, continues to have some productive cough.  The hycodan is performing its job. He is a bit concerned that the authorities have taken his rifle away, wrongfully.  He feels targeted and upset for this--not forthcoming  He denies any current si/hi.     Patient Active Problem List   Diagnosis Date Noted  . Subconjunctival hemorrhage of right eye 08/07/2017  . Mixed hyperlipidemia 08/07/2017    Past Medical History:  Diagnosis Date  . Allergy    SEASONAL  . Medical history non-contributory     Past Surgical History:  Procedure Laterality Date  . ANTERIOR CERVICAL DECOMP/DISCECTOMY FUSION N/A 07/29/2013   Procedure: ANTERIOR CERVICAL DECOMPRESSION/DISCECTOMY FUSION PLATING BONEGRAFT CERVICAL SIX-SEVEN;  Surgeon: Carmela HurtKyle L Cabbell, MD;  Location: [facility];  Service: Neurosurgery;  Laterality: N/A;    Social History   Tobacco Use  . Smoking status: Former Smoker    Packs/day: 1.00    Years: 20.00    Pack years: 20.00    Types: Cigarettes    Last attempt to quit: 12/17/2005    Years since quitting: 11.8  . Smokeless tobacco: Never Used  . Tobacco comment: Quit  Aug 2007  Substance Use Topics  . Alcohol use: Yes    Comment: occ. "during football season"  2-3 drinks on Sunday per pt.   . Drug use: No    Family History  Problem Relation Age of Onset  . Colon cancer Neg Hx     No Known  Allergies  Medication list has been reviewed and updated.  Current Outpatient Medications on File Prior to Visit  Medication Sig Dispense Refill  . atorvastatin (LIPITOR) 40 MG tablet Take 1 tablet (40 mg total) by mouth daily. 30 tablet 0  . atorvastatin (LIPITOR) 80 MG tablet Take 1 tablet (80 mg total) by mouth daily. Start this medication after you finish the 40mg  tablets in 1 month. 90 tablet 3  . HYDROcodone-homatropine (HYCODAN) 5-1.5 MG/5ML syrup 7190m by mouth a bedtime as needed for cough. 120 mL 0  . Multiple Vitamin (MULTIVITAMIN) tablet Take 1 tablet by mouth daily.    Marland Kitchen. azithromycin (ZITHROMAX) 250 MG tablet Take 2 pills by mouth on day 1, then 1 pill by mouth per day on days 2 through 5. 6 tablet 0  . tamsulosin (FLOMAX) 0.4 MG CAPS capsule Take 1 capsule (0.4 mg total) by mouth daily. (Patient not taking: Reported on 11/06/2017) 30 capsule 3   No current facility-administered medications on file prior to visit.     ROS ROS otherwise unremarkable unless listed above.  Physical Examination: BP 110/76   Pulse 69   Temp 98.1 F (36.7 C) (Oral)   Resp 16   Ht 6' 1.5" (1.867 m)   Wt 172 lb (78 kg)  SpO2 94%   BMI 22.38 kg/m  Ideal Body Weight: Weight in (lb) to have BMI = 25: 191.7  Physical Exam  Constitutional: He is oriented to person, place, and time. He appears well-developed and well-nourished. No distress.  HENT:  Head: Normocephalic and atraumatic.  Right Ear: Tympanic membrane, external ear and ear canal normal.  Left Ear: Tympanic membrane, external ear and ear canal normal.  Nose: Mucosal edema and rhinorrhea present. Right sinus exhibits no maxillary sinus tenderness and no frontal sinus tenderness. Left sinus exhibits no maxillary sinus tenderness and no frontal sinus tenderness.  Mouth/Throat: No uvula swelling. No oropharyngeal exudate, posterior oropharyngeal edema or posterior oropharyngeal erythema.  Eyes: Conjunctivae, EOM and lids are normal. Pupils  are equal, round, and reactive to light. Right eye exhibits normal extraocular motion. Left eye exhibits normal extraocular motion.  Neck: Trachea normal and full passive range of motion without pain. No edema and no erythema present.  Cardiovascular: Normal rate.  Pulmonary/Chest: Effort normal. No apnea. No respiratory distress. He has no decreased breath sounds. He has no wheezes. He has no rhonchi.  Neurological: He is alert and oriented to person, place, and time.  Skin: Skin is warm and dry. He is not diaphoretic.  Psychiatric: He has a normal mood and affect. His behavior is normal.     Assessment and Plan: Ivan Blackwell is a 56 y.o. male who is here today for cc of  Chief Complaint  Patient presents with  . Follow-up    Pneumonia   It appears that he is doing better.  He is able to communicate without any accessory use, disjointed sentences.  He declines any further bloodwork.  I have advised to return in 2 weeks for recheck of his xray.   There are no diagnoses linked to this encounter.  Trena PlattStephanie Scott Vanderveer, PA-C [facility] 11/06/2017 10:57 AM

## 2017-11-18 ENCOUNTER — Ambulatory Visit: Payer: PRIVATE HEALTH INSURANCE | Admitting: Family Medicine

## 2018-03-17 ENCOUNTER — Encounter: Payer: Self-pay | Admitting: Physician Assistant

## 2018-04-07 ENCOUNTER — Ambulatory Visit: Payer: Self-pay | Admitting: *Deleted

## 2018-04-07 ENCOUNTER — Other Ambulatory Visit: Payer: Self-pay

## 2018-04-07 ENCOUNTER — Encounter: Payer: Self-pay | Admitting: Family Medicine

## 2018-04-07 ENCOUNTER — Ambulatory Visit: Payer: PRIVATE HEALTH INSURANCE | Admitting: Family Medicine

## 2018-04-07 VITALS — BP 122/90 | HR 64 | Temp 98.1°F | Ht 74.0 in | Wt 182.6 lb

## 2018-04-07 DIAGNOSIS — S91332A Puncture wound without foreign body, left foot, initial encounter: Secondary | ICD-10-CM | POA: Diagnosis not present

## 2018-04-07 MED ORDER — CIPROFLOXACIN HCL 500 MG PO TABS: 500.0000 mg | ORAL_TABLET | Freq: Two times a day (BID) | ORAL | 0 refills | Status: DC

## 2018-04-07 NOTE — Telephone Encounter (Signed)
Patient  [facility], Pt asking if he can submerge his foot in water when he takes a bath. AVS reviewed with patient and the instructions stated not to submerge the extremity in water.He was advised  as such . He was  Advised to keep the area clean with soap and  Water.And to observe for signs of infection.

## 2018-04-07 NOTE — Patient Instructions (Addendum)
Although the puncture wound does not appear infected at this time, based on the type of puncture wound and your previous symptoms I would recommend antibiotic for the next 1 week to help protect against infection.  If you notice any increased redness, discharge, or worsening pain in that area, please return for recheck as that could indicate infection.  Luckily your tetanus is up-to-date as you had one less than 5 years ago.  As we discussed that it is okay to continue for 10 years before you need another tetanus shot.  If you do have another injury or puncture wound after 03/26/2019, I would recommend an updated immunization.  Thank you for coming in today.   Puncture Wound A puncture wound is an injury that is caused by a sharp, thin object that goes through (penetrates) your skin. Usually, a puncture wound does not leave a large opening in your skin, so it may not bleed a lot. However, when you get a puncture wound, dirt or other materials (foreign bodies) can be forced into your wound and break off inside. This increases the chance of infection, such as tetanus. What are the causes? Puncture wounds are caused by any sharp, thin object that goes through your skin, such as:  Animal teeth, as with an animal bite.  Sharp, pointed objects, such as nails, splinters of glass, fishhooks, and needles.  What are the signs or symptoms? Symptoms of a puncture wound include:  Pain.  Bleeding.  Swelling.  Bruising.  Fluid leaking from the wound.  Numbness, tingling, or loss of function.  How is this diagnosed? This condition is diagnosed with a medical history and physical exam. Your wound will be checked to see if it contains any foreign bodies. You may also have X-rays or other imaging tests. How is this treated? Treatment for a puncture wound depends on how serious the wound is. It also depends on whether the wound contains any foreign bodies. Treatment for all types of puncture wounds  usually starts with:  Controlling the bleeding.  Washing out the wound with a germ-free (sterile) salt-water solution.  Checking the wound for foreign bodies.  Treatment may also include:  Having the wound opened surgically to remove a foreign object.  Closing the wound with stitches (sutures) if it continues to bleed.  Covering the wound with antibiotic ointments and a bandage (dressing).  Receiving a tetanus shot.  Receiving a rabies vaccine.  Follow these instructions at home: Medicines  Take or apply over-the-counter and prescription medicines only as told by your health care provider.  If you were prescribed an antibiotic, take or apply it as told by your health care provider. Do not stop using the antibiotic even if your condition improves. Wound care  There are many ways to close and cover a wound. For example, a wound can be covered with sutures, skin glue, or adhesive strips.Follow instructions from your health care provider about: ? How to take care of your wound. ? When and how you should change your dressing. ? When you should remove your dressing. ? Removing whatever was used to close your wound.  Keep the dressing dry as told by your health care provider. Do not take baths, swim, use a hot tub, or do anything that would put your wound underwater until your health care provider approves.  Clean the wound as told by your health care provider.  Do not scratch or pick at the wound.  Check your wound every day for signs of  infection. Watch for: ? Redness, swelling, or pain. ? Fluid, blood, or pus. General instructions  Raise (elevate) the injured area above the level of your heart while you are sitting or lying down.  If your puncture wound is in your foot, ask your health care provider if you need to avoid putting weight on your foot and for how long.  Keep all follow-up visits as told by your health care provider. This is important. Contact a health care  provider if:  You received a tetanus shot and you have swelling, severe pain, redness, or bleeding at the injection site.  You have a fever.  Your sutures come out.  You notice a bad smell coming from your wound or your dressing.  You notice something coming out of your wound, such as wood or glass.  Your pain is not controlled with medicine.  You have increased redness, swelling, or pain at the site of your wound.  You have fluid, blood, or pus coming from your wound.  You notice a change in the color of your skin near your wound.  You need to change the dressing frequently due to fluid, blood, or pus draining from your wound.  You develop a new rash.  You develop numbness around your wound. Get help right away if:  You develop severe swelling around your wound.  Your pain suddenly increases and is severe.  You develop painful skin lumps.  You have a red streak going away from your wound.  The wound is on your hand or foot and you cannot properly move a finger or toe.  The wound is on your hand or foot and you notice that your fingers or toes look pale or bluish. This information is not intended to replace advice given to you by your health care provider. Make sure you discuss any questions you have with your health care provider. Document Released: 09/12/2005 Document Revised: 05/07/2016 Document Reviewed: 01/26/2015 Elsevier Interactive Patient Education  2018 [employer]    IF you received an x-ray today, you will receive an invoice from [facility]. Please contact [facility] at [phone] with questions or concerns regarding your invoice.   IF you received labwork today, you will receive an invoice from [facility]. Please contact [facility] at [phone] with questions or concerns regarding your invoice.   Our billing staff will not be able to assist you with questions regarding bills from these companies.  You will be contacted with the  lab results as soon as they are available. The fastest way to get your results is to activate your My Chart account. Instructions are located on the last page of this paperwork. If you have not heard from us regarding the results in 2 weeks, please contact this office.

## 2018-04-07 NOTE — Progress Notes (Signed)
By signing my name below, I, Schuyler Bain, attest that this documentation has been prepared under the direction and in the presence of Dr. Asencion PartridgeJeffrey R. Neva SeatGreene.   Electronically Signed: Marcelline MatesSchuyler Bain, Medical Scribe 04/07/2018 at 12:02 PM.  Subjective:    Patient ID: Ivan Blackwell, male    DOB: 11/01/1961, 57 y.o.   MRN: [id number] Chief Complaint  Patient presents with  . step on nail    left foot    HPI Ivan Blackwell is a 57 y.o. male who [facility] is here after stepping on a nail on his left foot. He has a hx of hyperlipidemia. Last TDAP listed at April 2015. He notes that he stepped on a nail 5 days ago and was wearing shoes when this happened. He notes that a full nail was able to be removed and was not rusted. He notes that the nail was part of a shingle which was fresh and had not been sitting outside for any significant period of time. He notes that his foot has decreased in pain over the last 5 days. He denies any pus, and cleaned it with an antiseptic towelette. He notes that his foot bones do not hurt. He notes that his foot did feel warm and swollen at the site of entry in the last couple days.    Immunization History  Administered Date(s) Administered  . Tdap 03/25/2014      Patient Active Problem List   Diagnosis Date Noted  . Subconjunctival hemorrhage of right eye 08/07/2017  . Mixed hyperlipidemia 08/07/2017   Past Medical History:  Diagnosis Date  . Allergy    SEASONAL  . Medical history non-contributory    Past Surgical History:  Procedure Laterality Date  . ANTERIOR CERVICAL DECOMP/DISCECTOMY FUSION N/A 07/29/2013   Procedure: ANTERIOR CERVICAL DECOMPRESSION/DISCECTOMY FUSION PLATING BONEGRAFT CERVICAL SIX-SEVEN;  Surgeon: Carmela HurtKyle L Cabbell, MD;  Location: [facility];  Service: Neurosurgery;  Laterality: N/A;   No Known Allergies Prior to Admission medications   Medication Sig Start Date End Date Taking? Authorizing Provider  atorvastatin  (LIPITOR) 80 MG tablet Take 1 tablet (80 mg total) by mouth daily. Start this medication after you finish the 40mg  tablets in 1 month. 08/07/17  Yes Wallis BambergMani, Mario, PA-C  Multiple Vitamin (MULTIVITAMIN) tablet Take 1 tablet by mouth daily.   Yes [provider]  atorvastatin (LIPITOR) 40 MG tablet Take 1 tablet (40 mg total) by mouth daily. Patient not taking: Reported on 04/07/2018 08/07/17   Wallis BambergMani, Mario, PA-C   Social History   Socioeconomic History  . Marital status: Married    Spouse name: Not on file  . Number of children: Not on file  . Years of education: Not on file  . Highest education level: Not on file  Occupational History  . Not on file  Social Needs  . Financial resource strain: Not on file  . Food insecurity:    Worry: Not on file    Inability: Not on file  . Transportation needs:    Medical: Not on file    Non-medical: Not on file  Tobacco Use  . Smoking status: Former Smoker    Packs/day: 1.00    Years: 20.00    Pack years: 20.00    Types: Cigarettes    Last attempt to quit: 12/17/2005    Years since quitting: 12.3  . Smokeless tobacco: Never Used  . Tobacco comment: Quit  Aug 2007  Substance and Sexual Activity  . Alcohol  use: Yes    Comment: occ. "during football season"  2-3 drinks on Sunday per pt.   . Drug use: No  . Sexual activity: Yes    Birth control/protection: None  Lifestyle  . Physical activity:    Days per week: Not on file    Minutes per session: Not on file  . Stress: Not on file  Relationships  . Social connections:    Talks on phone: Not on file    Gets together: Not on file    Attends religious service: Not on file    Active member of club or organization: Not on file    Attends meetings of clubs or organizations: Not on file    Relationship status: Not on file  . Intimate partner violence:    Fear of current or ex partner: Not on file    Emotionally abused: Not on file    Physically abused: Not on file    Forced sexual  activity: Not on file  Other Topics Concern  . Not on file  Social History Narrative  . Not on file    Review of Systems  Constitutional: Negative for chills, fatigue and fever.  Skin: Positive for wound (Left foot puncture). Negative for color change and rash.  Neurological: Negative for weakness.       Objective:   Physical Exam  Constitutional: He is oriented to person, place, and time. He appears well-developed and well-nourished. No distress.  HENT:  Head: Normocephalic and atraumatic.  Cardiovascular: Normal rate and regular rhythm.  Pulmonary/Chest: Effort normal and breath sounds normal. No respiratory distress.  Musculoskeletal:  No bony tenderness  Neurological: He is alert and oriented to person, place, and time.  Skin: Skin is warm and dry.  Puncture below mid-metatarsal area. Appears to be eschar. No surrounding erythema or discharge. Minimal tenderness.   Psychiatric: He has a normal mood and affect. His behavior is normal.   Vitals:   04/07/18 1151  BP: 122/90  Pulse: 64  Temp: 98.1 F (36.7 C)  TempSrc: Oral  SpO2: 99%  Weight: 182 lb 9.6 oz (82.8 kg)  Height: 6\' 2"  (1.88 m)          Assessment & Plan:    Ivan Blackwell is a 57 y.o. male Puncture wound of left foot, initial encounter - Plan: ciprofloxacin (CIPRO) 500 MG tablet  -Up-to-date on tetanus within 5 years.  -Start Cipro based on mechanism of injury including nail through his shoe into his foot and reported warmth of the area there recently. cipro for pseudomonal coverage.  No apparent signs of infection at present.  Potential side effects of Cipro were discussed, RTC precautions given.   Meds ordered this encounter  Medications  . ciprofloxacin (CIPRO) 500 MG tablet    Sig: Take 1 tablet (500 mg total) by mouth 2 (two) times daily.    Dispense:  14 tablet    Refill:  0   Patient Instructions    Although the puncture wound does not appear infected at this time, based on the type of  puncture wound and your previous symptoms I would recommend antibiotic for the next 1 week to help protect against infection.  If you notice any increased redness, discharge, or worsening pain in that area, please return for recheck as that could indicate infection.  Luckily your tetanus is up-to-date as you had one less than 5 years ago.  As we discussed that it is okay to continue for 10 years before you  need another tetanus shot.  If you do have another injury or puncture wound after 03/26/2019, I would recommend an updated immunization.  Thank you for coming in today.   Puncture Wound A puncture wound is an injury that is caused by a sharp, thin object that goes through (penetrates) your skin. Usually, a puncture wound does not leave a large opening in your skin, so it may not bleed a lot. However, when you get a puncture wound, dirt or other materials (foreign bodies) can be forced into your wound and break off inside. This increases the chance of infection, such as tetanus. What are the causes? Puncture wounds are caused by any sharp, thin object that goes through your skin, such as:  Animal teeth, as with an animal bite.  Sharp, pointed objects, such as nails, splinters of glass, fishhooks, and needles.  What are the signs or symptoms? Symptoms of a puncture wound include:  Pain.  Bleeding.  Swelling.  Bruising.  Fluid leaking from the wound.  Numbness, tingling, or loss of function.  How is this diagnosed? This condition is diagnosed with a medical history and physical exam. Your wound will be checked to see if it contains any foreign bodies. You may also have X-rays or other imaging tests. How is this treated? Treatment for a puncture wound depends on how serious the wound is. It also depends on whether the wound contains any foreign bodies. Treatment for all types of puncture wounds usually starts with:  Controlling the bleeding.  Washing out the wound with a germ-free  (sterile) salt-water solution.  Checking the wound for foreign bodies.  Treatment may also include:  Having the wound opened surgically to remove a foreign object.  Closing the wound with stitches (sutures) if it continues to bleed.  Covering the wound with antibiotic ointments and a bandage (dressing).  Receiving a tetanus shot.  Receiving a rabies vaccine.  Follow these instructions at home: Medicines  Take or apply over-the-counter and prescription medicines only as told by your health care provider.  If you were prescribed an antibiotic, take or apply it as told by your health care provider. Do not stop using the antibiotic even if your condition improves. Wound care  There are many ways to close and cover a wound. For example, a wound can be covered with sutures, skin glue, or adhesive strips.Follow instructions from your health care provider about: ? How to take care of your wound. ? When and how you should change your dressing. ? When you should remove your dressing. ? Removing whatever was used to close your wound.  Keep the dressing dry as told by your health care provider. Do not take baths, swim, use a hot tub, or do anything that would put your wound underwater until your health care provider approves.  Clean the wound as told by your health care provider.  Do not scratch or pick at the wound.  Check your wound every day for signs of infection. Watch for: ? Redness, swelling, or pain. ? Fluid, blood, or pus. General instructions  Raise (elevate) the injured area above the level of your heart while you are sitting or lying down.  If your puncture wound is in your foot, ask your health care provider if you need to avoid putting weight on your foot and for how long.  Keep all follow-up visits as told by your health care provider. This is important. Contact a health care provider if:  You received a tetanus shot  and you have swelling, severe pain, redness, or  bleeding at the injection site.  You have a fever.  Your sutures come out.  You notice a bad smell coming from your wound or your dressing.  You notice something coming out of your wound, such as wood or glass.  Your pain is not controlled with medicine.  You have increased redness, swelling, or pain at the site of your wound.  You have fluid, blood, or pus coming from your wound.  You notice a change in the color of your skin near your wound.  You need to change the dressing frequently due to fluid, blood, or pus draining from your wound.  You develop a new rash.  You develop numbness around your wound. Get help right away if:  You develop severe swelling around your wound.  Your pain suddenly increases and is severe.  You develop painful skin lumps.  You have a red streak going away from your wound.  The wound is on your hand or foot and you cannot properly move a finger or toe.  The wound is on your hand or foot and you notice that your fingers or toes look pale or bluish. This information is not intended to replace advice given to you by your health care provider. Make sure you discuss any questions you have with your health care provider. Document Released: 09/12/2005 Document Revised: 05/07/2016 Document Reviewed: 01/26/2015 Elsevier Interactive Patient Education  2018 [employer].    IF you received an x-ray today, you will receive an invoice from [facility]. Please contact [facility] at [phone] with questions or concerns regarding your invoice.   IF you received labwork today, you will receive an invoice from [facility]. Please contact [facility] at [phone] with questions or concerns regarding your invoice.   Our billing staff will not be able to assist you with questions regarding bills from these companies.  You will be contacted with the lab results as soon as they are available. The fastest way to get your results is to activate  your My Chart account. Instructions are located on the last page of this paperwork. If you have not heard from [place] regarding the results in 2 weeks, please contact this office.      I personally performed the services described in this documentation, which was scribed in my presence. The recorded information has been reviewed and considered for accuracy and completeness, addended by me as needed, and agree with information above.  Signed,   Meredith Staggers, MD Primary Care at [facility].  04/09/18 12:28 PM

## 2018-06-12 ENCOUNTER — Ambulatory Visit (INDEPENDENT_AMBULATORY_CARE_PROVIDER_SITE_OTHER): Payer: PRIVATE HEALTH INSURANCE

## 2018-06-12 ENCOUNTER — Other Ambulatory Visit: Payer: Self-pay

## 2018-06-12 ENCOUNTER — Ambulatory Visit: Payer: PRIVATE HEALTH INSURANCE | Admitting: Family Medicine

## 2018-06-12 ENCOUNTER — Encounter: Payer: Self-pay | Admitting: Family Medicine

## 2018-06-12 VITALS — BP 120/78 | HR 70 | Temp 97.4°F | Ht 74.0 in | Wt 182.2 lb

## 2018-06-12 DIAGNOSIS — S80212A Abrasion, left knee, initial encounter: Secondary | ICD-10-CM

## 2018-06-12 DIAGNOSIS — M25562 Pain in left knee: Secondary | ICD-10-CM | POA: Diagnosis not present

## 2018-06-12 DIAGNOSIS — M25469 Effusion, unspecified knee: Secondary | ICD-10-CM | POA: Diagnosis not present

## 2018-06-12 DIAGNOSIS — S82035A Nondisplaced transverse fracture of left patella, initial encounter for closed fracture: Secondary | ICD-10-CM | POA: Diagnosis not present

## 2018-06-12 NOTE — Progress Notes (Signed)
I,Arielle J Pollard,acting as a scribe for Shade Flood, MD.,have documented all relevant documentation on the behalf of Shade Flood, MD,as directed by  Shade Flood, MD while in the presence of Shade Flood, MD. 06/12/2018  4:33 PM  Subjective:    Patient ID: Ivan Blackwell, male    DOB: 04/06/1961, 57 y.o.   MRN: [id number]   Chief Complaint  Patient presents with  . left knee swelling    dull ache in the knee as well. fell on the knee last thursday. (1 week)    HPI Ivan Blackwell is a 57 y.o. male who presents to Primary Care at [facility] complaining of left knee swelling after falling 1 week ago.  He notes that he slipped and fell while vacationing at [facility]. He was near the pool and he lost his balance. He was trying not to fall in the pool. His body leaned and his left knee hit and scraped the ground. He denies any leg twisting at that time.   -Currently, he is concerned that the swelling hasn't decreased from his left knee. Initially, he couldn't walk on it well, but this has improved. He describes a dull constant pain that is worse when he walks and depending on his position when he lays down at night.   - He denies pain while straightening the leg, but there is pain and discomfort after bending it after a certain point. He also feels pain in the back and inside of his leg. He denies knee buckling. He placed ice on it, alternating for a few hours. He took ibuprofen 200 mg as needed and tylenol once yesterday.  He is able to sleep well. He denies any other prior injuries or surgeries to the knee.   Patient Active Problem List   Diagnosis Date Noted  . Subconjunctival hemorrhage of right eye 08/07/2017  . Mixed hyperlipidemia 08/07/2017   Past Medical History:  Diagnosis Date  . Allergy    SEASONAL  . Medical history non-contributory    Past Surgical History:  Procedure Laterality Date  . ANTERIOR CERVICAL DECOMP/DISCECTOMY FUSION N/A 07/29/2013   Procedure:  ANTERIOR CERVICAL DECOMPRESSION/DISCECTOMY FUSION PLATING BONEGRAFT CERVICAL SIX-SEVEN;  Surgeon: Carmela Hurt, MD;  Location: [facility];  Service: Neurosurgery;  Laterality: N/A;   No Known Allergies Prior to Admission medications   Medication Sig Start Date End Date Taking? Authorizing Provider  atorvastatin (LIPITOR) 40 MG tablet Take 1 tablet (40 mg total) by mouth daily. Patient not taking: Reported on 04/07/2018 08/07/17   Wallis Bamberg, PA-C  atorvastatin (LIPITOR) 80 MG tablet Take 1 tablet (80 mg total) by mouth daily. Start this medication after you finish the 40mg  tablets in 1 month. 08/07/17   Wallis Bamberg, PA-C  ciprofloxacin (CIPRO) 500 MG tablet Take 1 tablet (500 mg total) by mouth 2 (two) times daily. 04/07/18   Shade Flood, MD  Multiple Vitamin (MULTIVITAMIN) tablet Take 1 tablet by mouth daily.    [provider]   Social History   Socioeconomic History  . Marital status: Married    Spouse name: Not on file  . Number of children: Not on file  . Years of education: Not on file  . Highest education level: Not on file  Occupational History  . Not on file  Social Needs  . Financial resource strain: Not on file  . Food insecurity:    Worry: Not on file    Inability: Not on file  .  Transportation needs:    Medical: Not on file    Non-medical: Not on file  Tobacco Use  . Smoking status: Former Smoker    Packs/day: 1.00    Years: 20.00    Pack years: 20.00    Types: Cigarettes    Last attempt to quit: 12/17/2005    Years since quitting: 12.4  . Smokeless tobacco: Never Used  . Tobacco comment: Quit  Aug 2007  Substance and Sexual Activity  . Alcohol use: Yes    Comment: occ. "during football season"  2-3 drinks on Sunday per pt.   . Drug use: No  . Sexual activity: Yes    Birth control/protection: None  Lifestyle  . Physical activity:    Days per week: Not on file    Minutes per session: Not on file  . Stress: Not on file  Relationships  .  Social connections:    Talks on phone: Not on file    Gets together: Not on file    Attends religious service: Not on file    Active member of club or organization: Not on file    Attends meetings of clubs or organizations: Not on file    Relationship status: Not on file  . Intimate partner violence:    Fear of current or ex partner: Not on file    Emotionally abused: Not on file    Physically abused: Not on file    Forced sexual activity: Not on file  Other Topics Concern  . Not on file  Social History Narrative  . Not on file      Review of Systems     Objective:   Physical Exam  Musculoskeletal:  Initial bruising. Healing abrasions medial knee and anterior knee below patella. Slight discomfort in the mid aspect of the patella.  No tenderness in lateral joint line and fibular head. Slightly tender along the medial joint line anteriorly both tibia and femur.   Hamstring and popliteal fossa are non tender. 2+effusion. Minimal warmth without significant erythema. Good ROM Full extension of left knee.  Flexion to 80 degrees (-)Valgus, (-)Varus, (-)Anterior and posterior Drawer No appearant laxity with alachman.  McMurray is guarded, but pain anteriorly with flexion and rotation. No extensor lag. He denies numbness or tingling. NVI distally   Vitals:   06/12/18 1632  BP: 120/78  Pulse: 70  Temp: (!) 97.4 F (36.3 C)  TempSrc: Oral  SpO2: 97%  Weight: 182 lb 3.2 oz (82.6 kg)  Height: 6\' 2"  (1.88 m)   Dg Knee Complete 4 Views Left  Result Date: 06/12/2018 CLINICAL DATA:  Fall 1 week ago.  Patellar and medial knee pain. EXAM: LEFT KNEE - COMPLETE 4+ VIEW COMPARISON:  None. FINDINGS: There is a nondisplaced, transverse fracture the patella, just superior to the central patella. There is overlying soft tissue swelling and an associated joint effusion. No other fractures. The knee joint is normally spaced and aligned. No arthropathic changes. IMPRESSION: 1. Nondisplaced  fracture of the patella. 2. No other fractures.  No dislocation. Electronically Signed   By: Amie Portland M.D.   On: 06/12/2018 17:34        Assessment & Plan:   Ivan Blackwell is a 57 y.o. male Acute pain of left knee - Plan: DG Knee Complete 4 Views Left  Knee swelling - Plan: DG Knee Complete 4 Views Left  Abrasion, knee, left, initial encounter  Nondisplaced transverse fracture of left patella, initial encounter for closed fracture - Plan:  Apply other splint, Crutches, Ambulatory referral to Orthopedic Surgery  Multiple abrasions without apparent infection.  Unfortunately does also have a patellar fracture, transverse, nondisplaced at present.  Does have some medial joint line pain as well, potential for meniscal injury.  Has overall been doing well, but with transverse fracture will place in the immobilizer temporarily with crutches until he can be evaluated by orthopedics.  Symptomatic care discussed with wound care for abrasions as well as ibuprofen up to 600 mg every 6 hours as needed for pain.  Understanding of plan expressed.  RTC precautions given.   No orders of the defined types were placed in this encounter.  Patient Instructions    Unfortunately there is a fracture or broken bone in the patella, but it appears nondisplaced at this time. OK to use the knee immobilizer and crutches for now, ibuprofen up to 600 mg every 6 hours with food, and I will refer you to an orthopedist.  See other information below on abrasions - keep those areas clean with soap and water. Return to the clinic or go to the nearest emergency room if any of your symptoms worsen or new symptoms occur.   Patellar Fracture, Adult A patellar fracture is a break in the kneecap (patella). The patella is located on the front of the knee. A patellar fracture may make it difficult to walk. What are the causes? This condition may be caused by:  A fall or a hard, direct hit (blow) to the knee.  A very hard and  strong bending of the knee.  What increases the risk? The following factors make you more likely to experience a patellar fracture:  Playing contact sports or motor sports, especially sports that involve a lot of jumping.  Having bone abnormalities or diseases of the bone, such as osteoporosis or a bone tumor.  Having poor strength and flexibility.  Having metabolism disorders, hormone problems, or nutrition deficiencies and disorders, such as anorexia or bulimia.  What are the signs or symptoms? Symptoms of this condition include:  Tender and swollen knee.  Pain when moving the knee, especially when straightening it.  Difficulty walking or using the knee to support body weight (bearing weight).  Misshapen knee, as if a bone is out of place.  How is this diagnosed? This condition is diagnosed based on:  Your symptoms and medical history.  A physical exam.  X-rays.  How is this treated? Treatment for this condition depends on the type of fracture that you have:  If your patella is still in the right position after the fracture and you can still straighten your leg, you may need to wear a splint or cast for 4-6 weeks.  If your patella is broken into multiple pieces but you are able to straighten your leg, you can usually be treated with a splint or cast for 4-6 weeks. In some cases, the patella may need to be removed before the cast is applied.  If you cannot straighten your leg after a patellar fracture, you will need to have surgery to hold the patella together until it heals. After surgery, a splint or cast will be applied for 4-6 weeks.  You may be prescribed medicine to help relieve pain or prevent infection.  Follow these instructions at home: If you have a splint:  Wear the splint as told by your health care provider. Remove it only as told by your health care provider.  Loosen the splint if your toes tingle, become numb, or  turn cold and blue.  Keep the splint  clean.  If the splint is not waterproof: ? Do not let it get wet. ? Cover it with a watertight covering when you take a bath or a shower. If you have a cast:  Do not stick anything inside the cast to scratch your skin. Doing that increases your risk of infection.  Check the skin around the cast every day. Tell your health care provider about any concerns.  You may put lotion on dry skin around the edges of the cast. Do not put lotion on the skin underneath the cast.  Keep the cast clean.  If the cast is not waterproof: ? Do not let it get wet. ? Cover it with a watertight covering when you take a bath or a shower. Managing pain, stiffness, and swelling  If directed, put ice on the injured area: ? If you have a removable splint, remove it as told by your health care provider. ? Put ice in a plastic bag. ? Place a towel between your skin and the bag or between your cast and the bag. ? Leave the ice on for 20 minutes, 2-3 times a day.  Move your toes often to avoid stiffness and to lessen swelling.  Raise (elevate) the injured area above the level of your heart while you are sitting or lying down. Medicines  Take over-the-counter and prescription medicines only as told by your health care provider.  If you were prescribed an antibiotic medicine, use it as told by your health care provider. Do not stop taking the antibiotic even if you start to feel better. Activity  Return to your normal activities as told by your health care provider. Ask your health care provider what activities are safe for you.  Do exercises as told by your health care provider.  Ask your health care provider when it is safe to drive if you have a splint or cast on your leg.  Do not drive or use heavy machinery while taking prescription pain medicine. General instructions  Do not use the injured limb to support your body weight until your health care provider says that you can. Use crutches as told by  your health care provider.  Do not use any products that contain nicotine or tobacco, such as cigarettes and e-cigarettes. These can delay bone healing. If you need help quitting, ask your health care provider.  Keep all follow-up visits as told by your health care provider. This is important. Contact a health care provider if:  You have symptoms that get worse or do not get better after 2 weeks of treatment.  You have severe, persistent pain. Get help right away if:  You have redness, swelling, or increasing pain in your knee.  You have a fever.  You have blue or gray skin below the fracture site or in the toes.  You have numbness or loss of feeling below the fracture site. This information is not intended to replace advice given to you by your health care provider. Make sure you discuss any questions you have with your health care provider. Document Released: 09/01/2003 Document Revised: 09/14/2016 Document Reviewed: 09/14/2016 Elsevier Interactive Patient Education  2018 [employer].   Abrasion An abrasion is a cut or scrape on the outer surface of your skin. An abrasion does not extend through all of the layers of your skin. It is important to care for your abrasion properly to prevent infection. What are the causes? Most abrasions  are caused by falling on or gliding across the ground or another surface. When your skin rubs on something, the outer and inner layer of skin rubs off. What are the signs or symptoms? A cut or scrape is the main symptom of this condition. The scrape may be bleeding, or it may appear red or pink. If there was an associated fall, there may be an underlying bruise. How is this diagnosed? An abrasion is diagnosed with a physical exam. How is this treated? Treatment for this condition depends on how large and deep the abrasion is. Usually, your abrasion will be cleaned with water and mild soap. This removes any dirt or debris that may be stuck. An  antibiotic ointment may be applied to the abrasion to help prevent infection. A bandage (dressing) may be placed on the abrasion to keep it clean. You may also need a tetanus shot. Follow these instructions at home: Medicines  Take or apply medicines only as directed by your health care provider.  If you were prescribed an antibiotic ointment, finish all of it even if you start to feel better. Wound care  Clean the wound with mild soap and water 2-3 times per day or as directed by your health care provider. Pat your wound dry with a clean towel. Do not rub it.  There are many different ways to close and cover a wound. Follow instructions from your health care provider about: ? Wound care. ? Dressing changes and removal.  Check your wound every day for signs of infection. Watch for: ? Redness, swelling, or pain. ? Fluid, blood, or pus. General instructions   Keep the dressing dry as directed by your health care provider. Do not take baths, swim, use a hot tub, or do anything that would put your wound underwater until your health care provider approves.  If there is swelling, raise (elevate) the injured area above the level of your heart while you are sitting or lying down.  Keep all follow-up visits as directed by your health care provider. This is important. Contact a health care provider if:  You received a tetanus shot and you have swelling, severe pain, redness, or bleeding at the injection site.  Your pain is not controlled with medicine.  You have increased redness, swelling, or pain at the site of your wound. Get help right away if:  You have a red streak going away from your wound.  You have a fever.  You have fluid, blood, or pus coming from your wound.  You notice a bad smell coming from your wound or your dressing. This information is not intended to replace advice given to you by your health care provider. Make sure you discuss any questions you have with your  health care provider. Document Released: 09/12/2005 Document Revised: 08/03/2016 Document Reviewed: 12/01/2014 Elsevier Interactive Patient Education  2017 [employer]    IF you received an x-ray today, you will receive an invoice from [facility]. Please contact [facility] at [phone] with questions or concerns regarding your invoice.   IF you received labwork today, you will receive an invoice from [facility]. Please contact [facility] at [phone] with questions or concerns regarding your invoice.   Our billing staff will not be able to assist you with questions regarding bills from these companies.  You will be contacted with the lab results as soon as they are available. The fastest way to get your results is to activate your My Chart account. Instructions are located on  the last page of this paperwork. If you have not heard from [place] regarding the results in 2 weeks, please contact this office.       I personally performed the services described in this documentation, which was scribed in my presence. The recorded information has been reviewed and considered for accuracy and completeness, addended by me as needed, and agree with information above.  Signed,   Meredith Staggers, MD Primary Care at [facility].  06/13/18 10:04 PM

## 2018-06-12 NOTE — Patient Instructions (Addendum)
Unfortunately there is a fracture or broken bone in the patella, but it appears nondisplaced at this time. OK to use the knee immobilizer and crutches for now, ibuprofen up to 600 mg every 6 hours with food, and I will refer you to an orthopedist.  See other information below on abrasions - keep those areas clean with soap and water. Return to the clinic or go to the nearest emergency room if any of your symptoms worsen or new symptoms occur.   Patellar Fracture, Adult A patellar fracture is a break in the kneecap (patella). The patella is located on the front of the knee. A patellar fracture may make it difficult to walk. What are the causes? This condition may be caused by:  A fall or a hard, direct hit (blow) to the knee.  A very hard and strong bending of the knee.  What increases the risk? The following factors make you more likely to experience a patellar fracture:  Playing contact sports or motor sports, especially sports that involve a lot of jumping.  Having bone abnormalities or diseases of the bone, such as osteoporosis or a bone tumor.  Having poor strength and flexibility.  Having metabolism disorders, hormone problems, or nutrition deficiencies and disorders, such as anorexia or bulimia.  What are the signs or symptoms? Symptoms of this condition include:  Tender and swollen knee.  Pain when moving the knee, especially when straightening it.  Difficulty walking or using the knee to support body weight (bearing weight).  Misshapen knee, as if a bone is out of place.  How is this diagnosed? This condition is diagnosed based on:  Your symptoms and medical history.  A physical exam.  X-rays.  How is this treated? Treatment for this condition depends on the type of fracture that you have:  If your patella is still in the right position after the fracture and you can still straighten your leg, you may need to wear a splint or cast for 4-6 weeks.  If your  patella is broken into multiple pieces but you are able to straighten your leg, you can usually be treated with a splint or cast for 4-6 weeks. In some cases, the patella may need to be removed before the cast is applied.  If you cannot straighten your leg after a patellar fracture, you will need to have surgery to hold the patella together until it heals. After surgery, a splint or cast will be applied for 4-6 weeks.  You may be prescribed medicine to help relieve pain or prevent infection.  Follow these instructions at home: If you have a splint:  Wear the splint as told by your health care provider. Remove it only as told by your health care provider.  Loosen the splint if your toes tingle, become numb, or turn cold and blue.  Keep the splint clean.  If the splint is not waterproof: ? Do not let it get wet. ? Cover it with a watertight covering when you take a bath or a shower. If you have a cast:  Do not stick anything inside the cast to scratch your skin. Doing that increases your risk of infection.  Check the skin around the cast every day. Tell your health care provider about any concerns.  You may put lotion on dry skin around the edges of the cast. Do not put lotion on the skin underneath the cast.  Keep the cast clean.  If the cast is not waterproof: ? Do not  let it get wet. ? Cover it with a watertight covering when you take a bath or a shower. Managing pain, stiffness, and swelling  If directed, put ice on the injured area: ? If you have a removable splint, remove it as told by your health care provider. ? Put ice in a plastic bag. ? Place a towel between your skin and the bag or between your cast and the bag. ? Leave the ice on for 20 minutes, 2-3 times a day.  Move your toes often to avoid stiffness and to lessen swelling.  Raise (elevate) the injured area above the level of your heart while you are sitting or lying down. Medicines  Take over-the-counter and  prescription medicines only as told by your health care provider.  If you were prescribed an antibiotic medicine, use it as told by your health care provider. Do not stop taking the antibiotic even if you start to feel better. Activity  Return to your normal activities as told by your health care provider. Ask your health care provider what activities are safe for you.  Do exercises as told by your health care provider.  Ask your health care provider when it is safe to drive if you have a splint or cast on your leg.  Do not drive or use heavy machinery while taking prescription pain medicine. General instructions  Do not use the injured limb to support your body weight until your health care provider says that you can. Use crutches as told by your health care provider.  Do not use any products that contain nicotine or tobacco, such as cigarettes and e-cigarettes. These can delay bone healing. If you need help quitting, ask your health care provider.  Keep all follow-up visits as told by your health care provider. This is important. Contact a health care provider if:  You have symptoms that get worse or do not get better after 2 weeks of treatment.  You have severe, persistent pain. Get help right away if:  You have redness, swelling, or increasing pain in your knee.  You have a fever.  You have blue or gray skin below the fracture site or in the toes.  You have numbness or loss of feeling below the fracture site. This information is not intended to replace advice given to you by your health care provider. Make sure you discuss any questions you have with your health care provider. Document Released: 09/01/2003 Document Revised: 09/14/2016 Document Reviewed: 09/14/2016 Elsevier Interactive Patient Education  2018 [employer]   Abrasion An abrasion is a cut or scrape on the outer surface of your skin. An abrasion does not extend through all of the layers of your skin. It is  important to care for your abrasion properly to prevent infection. What are the causes? Most abrasions are caused by falling on or gliding across the ground or another surface. When your skin rubs on something, the outer and inner layer of skin rubs off. What are the signs or symptoms? A cut or scrape is the main symptom of this condition. The scrape may be bleeding, or it may appear red or pink. If there was an associated fall, there may be an underlying bruise. How is this diagnosed? An abrasion is diagnosed with a physical exam. How is this treated? Treatment for this condition depends on how large and deep the abrasion is. Usually, your abrasion will be cleaned with water and mild soap. This removes any dirt or debris that may be  stuck. An antibiotic ointment may be applied to the abrasion to help prevent infection. A bandage (dressing) may be placed on the abrasion to keep it clean. You may also need a tetanus shot. Follow these instructions at home: Medicines  Take or apply medicines only as directed by your health care provider.  If you were prescribed an antibiotic ointment, finish all of it even if you start to feel better. Wound care  Clean the wound with mild soap and water 2-3 times per day or as directed by your health care provider. Pat your wound dry with a clean towel. Do not rub it.  There are many different ways to close and cover a wound. Follow instructions from your health care provider about: ? Wound care. ? Dressing changes and removal.  Check your wound every day for signs of infection. Watch for: ? Redness, swelling, or pain. ? Fluid, blood, or pus. General instructions   Keep the dressing dry as directed by your health care provider. Do not take baths, swim, use a hot tub, or do anything that would put your wound underwater until your health care provider approves.  If there is swelling, raise (elevate) the injured area above the level of your heart while you  are sitting or lying down.  Keep all follow-up visits as directed by your health care provider. This is important. Contact a health care provider if:  You received a tetanus shot and you have swelling, severe pain, redness, or bleeding at the injection site.  Your pain is not controlled with medicine.  You have increased redness, swelling, or pain at the site of your wound. Get help right away if:  You have a red streak going away from your wound.  You have a fever.  You have fluid, blood, or pus coming from your wound.  You notice a bad smell coming from your wound or your dressing. This information is not intended to replace advice given to you by your health care provider. Make sure you discuss any questions you have with your health care provider. Document Released: 09/12/2005 Document Revised: 08/03/2016 Document Reviewed: 12/01/2014 Elsevier Interactive Patient Education  2017 [employer].    IF you received an x-ray today, you will receive an invoice from [facility]. Please contact [facility] at [phone] with questions or concerns regarding your invoice.   IF you received labwork today, you will receive an invoice from [place]. Please contact [facility] at [phone] with questions or concerns regarding your invoice.   Our billing staff will not be able to assist you with questions regarding bills from these companies.  You will be contacted with the lab results as soon as they are available. The fastest way to get your results is to activate your My Chart account. Instructions are located on the last page of this paperwork. If you have not heard from [place] regarding the results in 2 weeks, please contact this office.

## 2018-06-16 ENCOUNTER — Ambulatory Visit (INDEPENDENT_AMBULATORY_CARE_PROVIDER_SITE_OTHER): Payer: PRIVATE HEALTH INSURANCE | Admitting: Orthopedic Surgery

## 2018-06-16 ENCOUNTER — Encounter (INDEPENDENT_AMBULATORY_CARE_PROVIDER_SITE_OTHER): Payer: Self-pay | Admitting: Orthopedic Surgery

## 2018-06-16 ENCOUNTER — Ambulatory Visit (INDEPENDENT_AMBULATORY_CARE_PROVIDER_SITE_OTHER): Payer: PRIVATE HEALTH INSURANCE

## 2018-06-16 DIAGNOSIS — M25562 Pain in left knee: Secondary | ICD-10-CM

## 2018-06-16 MED ORDER — METHOCARBAMOL 500 MG PO TABS: ORAL_TABLET | ORAL | 0 refills | Status: DC

## 2018-06-16 MED ORDER — TRAMADOL HCL 50 MG PO TABS: ORAL_TABLET | ORAL | 0 refills | Status: DC

## 2018-06-17 ENCOUNTER — Encounter (INDEPENDENT_AMBULATORY_CARE_PROVIDER_SITE_OTHER): Payer: Self-pay | Admitting: Orthopedic Surgery

## 2018-06-17 NOTE — Progress Notes (Signed)
Office Visit Note   Patient: Ivan SeverJohn Blackwell           Date of Birth: 1961-09-26           MRN: [id number] Visit Date: 06/16/2018 Requested by: Shade FloodGreene, Jeffrey R, MD [address] [place], [place] [id number] PCP: System, Pcp Not In  Subjective: Chief Complaint  Patient presents with  . Left Knee - Injury    HPI: Ivan RuizJohn is a patient with left knee pain.  Date of injury June 17 while he was on vacation.  Patient was walking around the pool and sustained an impact injury to that left knee.  He is been ambulating with a knee immobilizer weightbearing.  He is also been bending the knee quite a bit based on my interview with him.  No prior injury to the knee.  Radiographs on the 10 system are reviewed and it does show a fairly minimally displaced transverse fracture of the patella.  No family history of pulmonary embolism or DVT.  He does not do physical type work.              ROS: All systems reviewed are negative as they relate to the chief complaint within the history of present illness.  Patient denies  fevers or chills.   Assessment & Plan: Visit Diagnoses:  1. Acute pain of left knee     Plan: Impression is left knee transverse patella fracture which based on the amount of activity he is been doing is inherently stable.  I think it is unlikely but possible that the fracture will displace.  I recommended to him that he continue weightbearing on that leg but keep the knee as straight as possible.  By keeping the leg straight there will be less of a distraction force on the fracture.  The fracture line is visible and does need time to heal.  Plan is knee immobilizer with 2-week to 2-1/2-week return repeat lateral radiograph at that time and likely initiation of some activity out of the knee immobilizer then if the fracture remains displaced.  Robaxin is prescribed.  He will need a lateral radiograph of that left knee on return in 2-1/2 weeks  Follow-Up Instructions: Return in about 2 weeks (around  06/30/2018).   Orders:  Orders Placed This Encounter  Procedures  . XR Knee 1-2 Views Left   Meds ordered this encounter  Medications  . traMADol (ULTRAM) 50 MG tablet    Sig: 1 po q 12hrs prn pain    Dispense:  25 tablet    Refill:  0  . methocarbamol (ROBAXIN) 500 MG tablet    Sig: 1 po q 8 hrs prn spasms    Dispense:  30 tablet    Refill:  0      Procedures: No procedures performed   Clinical Data: No additional findings.  Objective: Vital Signs: There were no vitals taken for this visit.  Physical Exam:   Constitutional: Patient appears well-developed HEENT:  Head: Normocephalic Eyes:EOM are normal Neck: Normal range of motion Cardiovascular: Normal rate Pulmonary/chest: Effort normal Neurologic: Patient is alert Skin: Skin is warm Psychiatric: Patient has normal mood and affect    Ortho Exam: Ortho exam demonstrates full active and passive range of motion of the right knee.  The left knee the patient bends to about 90 degrees which is not recommended.  He has intact extensor mechanism and mild effusion in the knee.  Collateral and cruciate ligaments feel stable but the exam is slightly guarded.  No calf tenderness today and pedal pulses are palpable.  Does have healing abrasions around that left knee.  Specialty Comments:  No specialty comments available.  Imaging: No results found.   PMFS History: Patient Active Problem List   Diagnosis Date Noted  . Subconjunctival hemorrhage of right eye 08/07/2017  . Mixed hyperlipidemia 08/07/2017   Past Medical History:  Diagnosis Date  . Allergy    SEASONAL  . Medical history non-contributory     Family History  Problem Relation Age of Onset  . Colon cancer Neg Hx     Past Surgical History:  Procedure Laterality Date  . ANTERIOR CERVICAL DECOMP/DISCECTOMY FUSION N/A 07/29/2013   Procedure: ANTERIOR CERVICAL DECOMPRESSION/DISCECTOMY FUSION PLATING BONEGRAFT CERVICAL SIX-SEVEN;  Surgeon: Carmela Hurt,  MD;  Location: [facility];  Service: Neurosurgery;  Laterality: N/A;   Social History   Occupational History  . Not on file  Tobacco Use  . Smoking status: Former Smoker    Packs/day: 1.00    Years: 20.00    Pack years: 20.00    Types: Cigarettes    Last attempt to quit: 12/17/2005    Years since quitting: 12.5  . Smokeless tobacco: Never Used  . Tobacco comment: Quit  Aug 2007  Substance and Sexual Activity  . Alcohol use: Yes    Comment: occ. "during football season"  2-3 drinks on Sunday per pt.   . Drug use: No  . Sexual activity: Yes    Birth control/protection: None

## 2018-07-04 ENCOUNTER — Ambulatory Visit (INDEPENDENT_AMBULATORY_CARE_PROVIDER_SITE_OTHER): Payer: PRIVATE HEALTH INSURANCE | Admitting: Orthopedic Surgery

## 2018-07-04 ENCOUNTER — Encounter (INDEPENDENT_AMBULATORY_CARE_PROVIDER_SITE_OTHER): Payer: Self-pay | Admitting: Orthopedic Surgery

## 2018-07-04 ENCOUNTER — Ambulatory Visit (INDEPENDENT_AMBULATORY_CARE_PROVIDER_SITE_OTHER): Payer: PRIVATE HEALTH INSURANCE

## 2018-07-04 DIAGNOSIS — M25562 Pain in left knee: Secondary | ICD-10-CM

## 2018-07-06 ENCOUNTER — Encounter (INDEPENDENT_AMBULATORY_CARE_PROVIDER_SITE_OTHER): Payer: Self-pay | Admitting: Orthopedic Surgery

## 2018-07-06 NOTE — Progress Notes (Signed)
   Post-Op Visit Note   Patient: Ivan SeverJohn Blackwell           Date of Birth: 11-12-1961           MRN: [id number] Visit Date: 07/04/2018 PCP: System, Pcp Not In   Assessment & Plan:  Chief Complaint:  Chief Complaint  Patient presents with  . Left Knee - Follow-up   Visit Diagnoses:  1. Acute pain of left knee     Plan: Ivan RuizJohn is a patient with left knee pain.  Has a patella fracture.  Date of injury 06/02/2018.  He is been ambulating.  In general he states his knee is doing well.  Has only occasional sporadic pain which is not severe.  On exam he has full active extension and flexion.  He has no effusion.  Collateral and cruciate ligaments are stable and there is no tenderness to palpation of the patella itself.  Has a little bit of periretinacular tenderness.  Radiographs look good with no change in alignment or position.  Plan at this time is guarded activity for the next 2 weeks then he can proceed with activity as tolerated.  He is been very active with this fracture and it has healed progressively very well.  In the absence of a fall or jump I think he should be fine over the next 2 weeks to do normal ambulation without the knee immobilizer.  I do not think he has been wearing the knee immobilizer by his own admission over the past week and a half  Follow-Up Instructions: Return if symptoms worsen or fail to improve.   Orders:  Orders Placed This Encounter  Procedures  . XR Knee 1-2 Views Left   No orders of the defined types were placed in this encounter.   Imaging: No results found.  PMFS History: Patient Active Problem List   Diagnosis Date Noted  . Subconjunctival hemorrhage of right eye 08/07/2017  . Mixed hyperlipidemia 08/07/2017   Past Medical History:  Diagnosis Date  . Allergy    SEASONAL  . Medical history non-contributory     Family History  Problem Relation Age of Onset  . Colon cancer Neg Hx     Past Surgical History:  Procedure Laterality Date  .  ANTERIOR CERVICAL DECOMP/DISCECTOMY FUSION N/A 07/29/2013   Procedure: ANTERIOR CERVICAL DECOMPRESSION/DISCECTOMY FUSION PLATING BONEGRAFT CERVICAL SIX-SEVEN;  Surgeon: Carmela HurtKyle L Cabbell, MD;  Location: [facility];  Service: Neurosurgery;  Laterality: N/A;   Social History   Occupational History  . Not on file  Tobacco Use  . Smoking status: Former Smoker    Packs/day: 1.00    Years: 20.00    Pack years: 20.00    Types: Cigarettes    Last attempt to quit: 12/17/2005    Years since quitting: 12.5  . Smokeless tobacco: Never Used  . Tobacco comment: Quit  Aug 2007  Substance and Sexual Activity  . Alcohol use: Yes    Comment: occ. "during football season"  2-3 drinks on Sunday per pt.   . Drug use: No  . Sexual activity: Yes    Birth control/protection: None

## 2018-07-10 ENCOUNTER — Telehealth (INDEPENDENT_AMBULATORY_CARE_PROVIDER_SITE_OTHER): Payer: Self-pay

## 2018-07-10 ENCOUNTER — Encounter (INDEPENDENT_AMBULATORY_CARE_PROVIDER_SITE_OTHER): Payer: Self-pay | Admitting: Orthopaedic Surgery

## 2018-07-10 ENCOUNTER — Ambulatory Visit (INDEPENDENT_AMBULATORY_CARE_PROVIDER_SITE_OTHER): Payer: PRIVATE HEALTH INSURANCE | Admitting: Orthopaedic Surgery

## 2018-07-10 ENCOUNTER — Ambulatory Visit (INDEPENDENT_AMBULATORY_CARE_PROVIDER_SITE_OTHER): Payer: PRIVATE HEALTH INSURANCE

## 2018-07-10 DIAGNOSIS — G8929 Other chronic pain: Secondary | ICD-10-CM | POA: Insufficient documentation

## 2018-07-10 DIAGNOSIS — M25562 Pain in left knee: Secondary | ICD-10-CM

## 2018-07-10 NOTE — Progress Notes (Signed)
Office Visit Note   Patient: Ivan SeverJohn Blackwell           Date of Birth: 04/15/1961           MRN: [id number] Visit Date: 07/10/2018              Requested by: No referring provider defined for this encounter. PCP: System, Pcp Not In   Assessment & Plan: Visit Diagnoses:  1. Chronic pain of left knee     Plan: Impression is nondisplaced patella fracture, left knee.  At this point, we will place the patient in a hinged knee brace.  He has been instructed to take it easy with activity and use good judgment.  He will follow-up with us in 2 weeks time.  Call if concerns or questions in the meantime.  Total face to face encounter time was greater than 25 minutes and over half of this time was spent in counseling and/or coordination of care.   Follow-Up Instructions: Return in about 2 weeks (around 07/24/2018).   Orders:  Orders Placed This Encounter  Procedures  . XR KNEE 3 VIEW LEFT   No orders of the defined types were placed in this encounter.     Procedures: No procedures performed   Clinical Data: No additional findings.   Subjective: Chief Complaint  Patient presents with  . Left Knee - Pain    HPI patient is a 57 year old gentleman who presents to our clinic today following an injury to his left knee.  He sustained a nondisplaced patella fracture approximately 4 weeks ago from a fall.  He was seen by Dr. August Saucerean for this.  He was initially placed in a knee immobilizer weightbearing as tolerated for the first 2 weeks, but it appears that he was not compliant with this.  He was doing very well over the past 2 weeks, but last week he slipped in mud fully extending the left knee causing marked pain and swelling.    Review of Systems as detailed in HPI.  All others reviewed and are negative.   Objective: Vital Signs: There were no vitals taken for this visit.  Physical Exam well-developed well-nourished gentleman in no acute distress.  Alert and oriented x3.  Ortho Exam  examination of the left knee reveals a small effusion.  Range of motion from 0 to 90 degrees.  Moderate tenderness over the patella.  He is able to fully straight leg raise.  He is neurovascularly intact distally  Specialty Comments:  No specialty comments available.  Imaging: Xr Knee 3 View Left  Result Date: 07/10/2018 Stable alignment of the fracture without displacement    PMFS History: Patient Active Problem List   Diagnosis Date Noted  . Chronic pain of left knee 07/10/2018  . Subconjunctival hemorrhage of right eye 08/07/2017  . Mixed hyperlipidemia 08/07/2017   Past Medical History:  Diagnosis Date  . Allergy    SEASONAL  . Medical history non-contributory     Family History  Problem Relation Age of Onset  . Colon cancer Neg Hx     Past Surgical History:  Procedure Laterality Date  . ANTERIOR CERVICAL DECOMP/DISCECTOMY FUSION N/A 07/29/2013   Procedure: ANTERIOR CERVICAL DECOMPRESSION/DISCECTOMY FUSION PLATING BONEGRAFT CERVICAL SIX-SEVEN;  Surgeon: Carmela HurtKyle L Cabbell, MD;  Location: [facility];  Service: Neurosurgery;  Laterality: N/A;   Social History   Occupational History  . Not on file  Tobacco Use  . Smoking status: Former Smoker    Packs/day: 1.00  Years: 20.00    Pack years: 20.00    Types: Cigarettes    Last attempt to quit: 12/17/2005    Years since quitting: 12.5  . Smokeless tobacco: Never Used  . Tobacco comment: Quit  Aug 2007  Substance and Sexual Activity  . Alcohol use: Yes    Comment: occ. "during football season"  2-3 drinks on Sunday per pt.   . Drug use: No  . Sexual activity: Yes    Birth control/protection: None

## 2018-07-10 NOTE — Telephone Encounter (Signed)
Patient walked into office requesting to be seen today for new injury to knee.

## 2019-11-24 ENCOUNTER — Ambulatory Visit: Payer: PRIVATE HEALTH INSURANCE | Admitting: Emergency Medicine

## 2019-11-27 ENCOUNTER — Encounter: Payer: Self-pay | Admitting: Registered Nurse

## 2019-11-27 ENCOUNTER — Ambulatory Visit: Payer: PRIVATE HEALTH INSURANCE | Admitting: Registered Nurse

## 2019-11-27 ENCOUNTER — Other Ambulatory Visit: Payer: Self-pay

## 2019-11-27 VITALS — BP 118/84 | HR 71 | Temp 98.0°F | Ht 74.0 in | Wt 192.4 lb

## 2019-11-27 DIAGNOSIS — Z7689 Persons encountering health services in other specified circumstances: Secondary | ICD-10-CM

## 2019-11-27 DIAGNOSIS — Z1329 Encounter for screening for other suspected endocrine disorder: Secondary | ICD-10-CM

## 2019-11-27 DIAGNOSIS — N529 Male erectile dysfunction, unspecified: Secondary | ICD-10-CM | POA: Diagnosis not present

## 2019-11-27 DIAGNOSIS — Z13228 Encounter for screening for other metabolic disorders: Secondary | ICD-10-CM

## 2019-11-27 DIAGNOSIS — E782 Mixed hyperlipidemia: Secondary | ICD-10-CM | POA: Diagnosis not present

## 2019-11-27 DIAGNOSIS — Z23 Encounter for immunization: Secondary | ICD-10-CM

## 2019-11-27 DIAGNOSIS — R351 Nocturia: Secondary | ICD-10-CM

## 2019-11-27 DIAGNOSIS — Z114 Encounter for screening for human immunodeficiency virus [HIV]: Secondary | ICD-10-CM

## 2019-11-27 DIAGNOSIS — Z0000 Encounter for general adult medical examination without abnormal findings: Secondary | ICD-10-CM

## 2019-11-27 DIAGNOSIS — Z130 Encounter for screening for diseases of the blood and blood-forming organs and certain disorders involving the immune mechanism: Secondary | ICD-10-CM

## 2019-11-27 MED ORDER — DOXAZOSIN MESYLATE 1 MG PO TABS: 1.0000 mg | ORAL_TABLET | Freq: Every day | ORAL | 0 refills | Status: DC

## 2019-11-27 MED ORDER — TADALAFIL 20 MG PO TABS: 20.0000 mg | ORAL_TABLET | Freq: Every day | ORAL | 11 refills | Status: DC | PRN

## 2019-11-27 NOTE — Progress Notes (Signed)
Established Patient Office Visit  Subjective:  Patient ID: Ivan Blackwell, male    DOB: 17-Apr-1961  Age: 58 y.o. MRN: [id number]  CC:  Chief Complaint  Patient presents with  . Establish Care    Pt declined Flu vac    HPI Ivan Blackwell presents for visit to establish care.  Has two problems he would like to address today:  Nocturia: getting up 4-5 times each night to urinate, at the very least around every 3 hours. States this has been a gradual onset problem. He denies weak stream, starting and stopping of stream, and hematuria. No pain.  ED: Worsening over the previous few years. Has been on tadalafil 20mg  PO PRN for this in the past, hoping to go back on this today.  Otherwise, feels well overall, no acute complaints. Will draw labs today.   Past Medical History:  Diagnosis Date  . Allergy    SEASONAL  . Medical history non-contributory     Past Surgical History:  Procedure Laterality Date  . ANTERIOR CERVICAL DECOMP/DISCECTOMY FUSION N/A 07/29/2013   Procedure: ANTERIOR CERVICAL DECOMPRESSION/DISCECTOMY FUSION PLATING BONEGRAFT CERVICAL SIX-SEVEN;  Surgeon: Winfield Cunas, MD;  Location: [facility];  Service: Neurosurgery;  Laterality: N/A;    Family History  Problem Relation Age of Onset  . Colon cancer Neg Hx     Social History   Socioeconomic History  . Marital status: Married    Spouse name: Not on file  . Number of children: Not on file  . Years of education: Not on file  . Highest education level: Not on file  Occupational History  . Not on file  Tobacco Use  . Smoking status: Former Smoker    Packs/day: 1.00    Years: 20.00    Pack years: 20.00    Types: Cigarettes    Quit date: 12/17/2005    Years since quitting: 13.9  . Smokeless tobacco: Never Used  . Tobacco comment: Quit  Aug 2007  Substance and Sexual Activity  . Alcohol use: Yes    Comment: occ. "during football season"  2-3 drinks on Sunday per pt.   . Drug use: No  . Sexual activity: Yes     Birth control/protection: None  Other Topics Concern  . Not on file  Social History Narrative  . Not on file   Social Determinants of Health   Financial Resource Strain:   . Difficulty of Paying Living Expenses: Not on file  Food Insecurity:   . Worried About Charity fundraiser in the Last Year: Not on file  . Ran Out of Food in the Last Year: Not on file  Transportation Needs:   . Lack of Transportation (Medical): Not on file  . Lack of Transportation (Non-Medical): Not on file  Physical Activity:   . Days of Exercise per Week: Not on file  . Minutes of Exercise per Session: Not on file  Stress:   . Feeling of Stress : Not on file  Social Connections:   . Frequency of Communication with Friends and Family: Not on file  . Frequency of Social Gatherings with Friends and Family: Not on file  . Attends Religious Services: Not on file  . Active Member of Clubs or Organizations: Not on file  . Attends Archivist Meetings: Not on file  . Marital Status: Not on file  Intimate Partner Violence:   . Fear of Current or Ex-Partner: Not on file  . Emotionally Abused: Not on file  .  Physically Abused: Not on file  . Sexually Abused: Not on file    Outpatient Medications Prior to Visit  Medication Sig Dispense Refill  . methocarbamol (ROBAXIN) 500 MG tablet 1 po q 8 hrs prn spasms 30 tablet 0  . Multiple Vitamin (MULTIVITAMIN) tablet Take 1 tablet by mouth daily.    . traMADol (ULTRAM) 50 MG tablet 1 po q 12hrs prn pain 25 tablet 0   No facility-administered medications prior to visit.    No Known Allergies  ROS Review of Systems  Constitutional: Negative.   HENT: Negative.   Eyes: Negative.   Respiratory: Negative.   Cardiovascular: Negative.   Gastrointestinal: Negative.   Endocrine: Negative.   Musculoskeletal: Negative.   Skin: Negative.   Allergic/Immunologic: Negative.   Neurological: Negative.   Hematological: Negative.   Psychiatric/Behavioral:  Negative.   All other systems reviewed and are negative.     Objective:    Physical Exam  Constitutional: He is oriented to person, place, and time. He appears well-developed and well-nourished. No distress.  Cardiovascular: Normal rate and regular rhythm.  Pulmonary/Chest: Effort normal. No respiratory distress.  Neurological: He is alert and oriented to person, place, and time.  Skin: Skin is warm and dry. No rash noted. He is not diaphoretic. No erythema. No pallor.  Psychiatric: He has a normal mood and affect. His behavior is normal. Judgment and thought content normal.  Nursing note and vitals reviewed.   BP 118/84 (BP Location: Left Arm, Patient Position: Sitting, Cuff Size: Normal)   Pulse 71   Temp 98 F (36.7 C) (Temporal)   Ht 6\' 2"  (1.88 m)   Wt 192 lb 6.4 oz (87.3 kg)   SpO2 96%   BMI 24.70 kg/m  Wt Readings from Last 3 Encounters:  11/27/19 192 lb 6.4 oz (87.3 kg)  06/12/18 182 lb 3.2 oz (82.6 kg)  04/07/18 182 lb 9.6 oz (82.8 kg)     Health Maintenance Due  Topic Date Due  . HIV Screening  05/26/1978    There are no preventive care reminders to display for this patient.  Lab Results  Component Value Date   TSH 1.66 10/26/2016   Lab Results  Component Value Date   WBC 10.8 (A) 11/04/2017   HGB 12.2 (A) 11/04/2017   HCT 36.3 (A) 11/04/2017   MCV 87.7 11/04/2017   PLT 250 10/26/2016   Lab Results  Component Value Date   NA 140 10/26/2016   K 4.9 10/26/2016   CO2 20 10/26/2016   GLUCOSE 77 10/26/2016   BUN 18 10/26/2016   CREATININE 0.93 10/26/2016   BILITOT 0.7 03/25/2014   ALKPHOS 51 03/25/2014   AST 22 03/25/2014   ALT 24 03/25/2014   PROT 6.9 03/25/2014   ALBUMIN 4.3 03/25/2014   CALCIUM 9.3 10/26/2016   Lab Results  Component Value Date   CHOL 199 10/26/2016   Lab Results  Component Value Date   HDL 26 (L) 10/26/2016   Lab Results  Component Value Date   LDLCALC NOT CALC 10/26/2016   Lab Results  Component Value Date    TRIG 926 (H) 10/26/2016   Lab Results  Component Value Date   CHOLHDL 7.7 (H) 10/26/2016   No results found for: HGBA1C    Assessment & Plan:   Problem List Items Addressed This Visit      Other   Mixed hyperlipidemia   Relevant Medications   tadalafil (CIALIS) 20 MG tablet   doxazosin (CARDURA) 1 MG tablet  Other Relevant Orders   Lipid panel   Comprehensive metabolic panel    Other Visit Diagnoses    Screening for thyroid disorder    -  Primary   Relevant Orders   TSH   Need for prophylactic vaccination and inoculation against influenza       Screening for HIV (human immunodeficiency virus)       Routine health maintenance       Screening for endocrine, metabolic and immunity disorder       Relevant Orders   Comprehensive metabolic panel   TSH   CBC with Differential   Hemoglobin A1c   Erectile dysfunction, unspecified erectile dysfunction type       Relevant Medications   tadalafil (CIALIS) 20 MG tablet   Nocturia       Relevant Medications   doxazosin (CARDURA) 1 MG tablet      Meds ordered this encounter  Medications  . tadalafil (CIALIS) 20 MG tablet    Sig: Take 1 tablet (20 mg total) by mouth daily as needed for erectile dysfunction.    Dispense:  30 tablet    Refill:  11    Order Specific Question:   Supervising Provider    Answer:   Collie SiadSTALLINGS, ZOE A [id number]]  . doxazosin (CARDURA) 1 MG tablet    Sig: Take 1 tablet (1 mg total) by mouth daily.    Dispense:  90 tablet    Refill:  0    Order Specific Question:   Supervising Provider    Answer:   Doristine BosworthSTALLINGS, ZOE A [id number]]    Follow-up: No follow-ups on file.   PLAN  Tadalafil 20mg  PO qd sent to pharmacy. If not covered by insurance, he may take GoodRx card or we can send to a different pharmacy  Will start low dose doxazosin for nocturia at 1mg  PO qd. With good effect, may consider titrating dose upwards until desire effect reached.  Will plan to follow up in 3 mos.  Patient encouraged to  call clinic with any questions, comments, or concerns.   Janeece Ageeichard Sheena Donegan, NP

## 2019-11-27 NOTE — Patient Instructions (Signed)
° ° ° °  If you have lab work done today you will be contacted with your lab results within the next 2 weeks.  If you have not heard from us then please contact us. The fastest way to get your results is to register for My Chart. ° ° °IF you received an x-ray today, you will receive an invoice from [facility]. Please contact [facility] at [phone] with questions or concerns regarding your invoice.  ° °IF you received labwork today, you will receive an invoice from [facility]. Please contact [facility] at [phone] with questions or concerns regarding your invoice.  ° °Our billing staff will not be able to assist you with questions regarding bills from these companies. ° °You will be contacted with the lab results as soon as they are available. The fastest way to get your results is to activate your My Chart account. Instructions are located on the last page of this paperwork. If you have not heard from us regarding the results in 2 weeks, please contact this office. °  ° ° ° °

## 2019-11-28 LAB — LIPID PANEL
Chol/HDL Ratio: 4.1 ratio (ref 0.0–5.0)
Cholesterol, Total: 176 mg/dL (ref 100–199)
HDL: 43 mg/dL (ref >39)
LDL Chol Calc (NIH): 102 mg/dL — ABNORMAL HIGH (ref 0–99)
Triglycerides: 179 mg/dL — ABNORMAL HIGH (ref 0–149)
VLDL Cholesterol Cal: 31 mg/dL (ref 5–40)

## 2019-11-28 LAB — COMPREHENSIVE METABOLIC PANEL
ALT: 22 IU/L (ref 0–44)
AST: 25 IU/L (ref 0–40)
Albumin/Globulin Ratio: 1.9 (ref 1.2–2.2)
Albumin: 4.6 g/dL (ref 3.8–4.9)
Alkaline Phosphatase: 66 IU/L (ref 39–117)
BUN/Creatinine Ratio: 16 (ref 9–20)
BUN: 16 mg/dL (ref 6–24)
Bilirubin Total: 0.4 mg/dL (ref 0.0–1.2)
CO2: 23 mmol/L (ref 20–29)
Calcium: 9.1 mg/dL (ref 8.7–10.2)
Chloride: 103 mmol/L (ref 96–106)
Creatinine, Ser: 1.03 mg/dL (ref 0.76–1.27)
GFR calc Af Amer: 93 mL/min/{1.73_m2} (ref >59)
GFR calc non Af Amer: 81 mL/min/{1.73_m2} (ref >59)
Globulin, Total: 2.4 g/dL (ref 1.5–4.5)
Glucose: 85 mg/dL (ref 65–99)
Potassium: 4.7 mmol/L (ref 3.5–5.2)
Sodium: 139 mmol/L (ref 134–144)
Total Protein: 7 g/dL (ref 6.0–8.5)

## 2019-11-28 LAB — CBC WITH DIFFERENTIAL/PLATELET
Basophils Absolute: 0 10*3/uL (ref 0.0–0.2)
Basos: 1 %
EOS (ABSOLUTE): 0.2 10*3/uL (ref 0.0–0.4)
Eos: 4 %
Hematocrit: 46.4 % (ref 37.5–51.0)
Hemoglobin: 15.5 g/dL (ref 13.0–17.7)
Immature Grans (Abs): 0 10*3/uL (ref 0.0–0.1)
Immature Granulocytes: 0 %
Lymphocytes Absolute: 2.2 10*3/uL (ref 0.7–3.1)
Lymphs: 35 %
MCH: 30.4 pg (ref 26.6–33.0)
MCHC: 33.4 g/dL (ref 31.5–35.7)
MCV: 91 fL (ref 79–97)
Monocytes Absolute: 0.5 10*3/uL (ref 0.1–0.9)
Monocytes: 7 %
Neutrophils Absolute: 3.4 10*3/uL (ref 1.4–7.0)
Neutrophils: 53 %
Platelets: 219 10*3/uL (ref 150–450)
RBC: 5.1 x10E6/uL (ref 4.14–5.80)
RDW: 12.7 % (ref 11.6–15.4)
WBC: 6.3 10*3/uL (ref 3.4–10.8)

## 2019-11-28 LAB — HEMOGLOBIN A1C
Est. average glucose Bld gHb Est-mCnc: 100 mg/dL
Hgb A1c MFr Bld: 5.1 % (ref 4.8–5.6)

## 2019-11-28 LAB — TSH: TSH: 2.75 u[IU]/mL (ref 0.450–4.500)

## 2019-11-30 ENCOUNTER — Encounter: Payer: Self-pay | Admitting: Radiology

## 2019-11-30 NOTE — Progress Notes (Signed)
Good morning,  If someone could send Mr. Ivan Blackwell a normal results letter please, that would be great.  Thank you  Kathrin Ruddy, NP

## 2019-12-04 ENCOUNTER — Other Ambulatory Visit: Payer: Self-pay | Admitting: Registered Nurse

## 2019-12-04 ENCOUNTER — Telehealth: Payer: Self-pay

## 2019-12-04 DIAGNOSIS — M545 Low back pain, unspecified: Secondary | ICD-10-CM

## 2019-12-04 DIAGNOSIS — N529 Male erectile dysfunction, unspecified: Secondary | ICD-10-CM

## 2019-12-04 MED ORDER — METHOCARBAMOL 500 MG PO TABS: ORAL_TABLET | ORAL | 0 refills | Status: DC

## 2019-12-04 MED ORDER — MELOXICAM 15 MG PO TABS: 15.0000 mg | ORAL_TABLET | Freq: Every day | ORAL | 0 refills | Status: DC

## 2019-12-04 MED ORDER — SILDENAFIL CITRATE 50 MG PO TABS: 50.0000 mg | ORAL_TABLET | Freq: Every day | ORAL | 0 refills | Status: DC | PRN

## 2019-12-04 NOTE — Telephone Encounter (Signed)
Ivan Blackwell,   Ivan Blackwell has concerns that the Cardura hasn't resulted in any improvement with nocturia.  If you have any advice on this, please let him/us know.     Pt states he took one Cialis on the afternoon of 12/14.  Medication was effective for ED.  States on 12/16 began experiencing significant back pain that felt like a sledgehammer hitting his back and traveled to shoulders and legs.  No other complaints.  Seems positional and somewhat relieved by Tylenol.  Is improving but concerned that it was caused by the Cialis.  Spoke to Kathrin Ruddy, NP who will send in medication.  Stop Cialis.  Call next week if no improvement in back.  Pt verbalizes understanding.

## 2019-12-07 ENCOUNTER — Telehealth: Payer: Self-pay

## 2019-12-07 NOTE — Telephone Encounter (Signed)
Very called very upset about automated phone procedures and stated he was hung up on. Pt was rude and using vulgar language. Pt stated he went to pharmacy to pick up SILDENAFIL and pharmacy advised him that his insurance would not cover it and that he needed to call his PCP. I advised Pt that it may need a PA or he could call insurance for the options covered. Pt stated he doesnt want to jump through hoops and asked for a call from Dr. Deniece Ree so that something else can be called in/ please advise

## 2019-12-07 NOTE — Telephone Encounter (Signed)
Please advise 

## 2019-12-08 NOTE — Telephone Encounter (Signed)
Pt. Called requesting he be called by provider or NP. Pt. Wishes to discuss options for medications that would not require PA if possible. Pt. Still wishes the PA to be pursued in the mean time.

## 2019-12-08 NOTE — Telephone Encounter (Signed)
Hello -  I called the patient back, no answer. Left brief VM stating that I will try him again at a later time. We can go ahead and try the prior auth - hopefully this solves the problem.  Thank you   Kathrin Ruddy, NP

## 2019-12-28 NOTE — Telephone Encounter (Signed)
Pt. Called again to check on status of prior auth. Given directly to [place] to file. Please either contact me so I can call the pt. Or call the pt. At [phone] to let him know the authorization has been filed.  Pt. Had further questions about the intensity of the medication meloxicam

## 2019-12-28 NOTE — Telephone Encounter (Signed)
[employer], nor pharmacy faxed PA for this medication. PA request being attempted.

## 2019-12-29 ENCOUNTER — Telehealth: Payer: Self-pay | Admitting: *Deleted

## 2019-12-29 NOTE — Telephone Encounter (Signed)
Left message on patient voice mail.  The patient insurance company with [employer] was called twice [employer] is stating they do not have him on file .   Patient will need to confirm with insurance he is on plan.  The number on the card only has Rosey Bath on plan according to them.

## 2019-12-29 NOTE — Telephone Encounter (Signed)
Please advise patient that the insurance company was called twice and they are saying he is not on this plan.    They were unable to find him.  He will need to call insurance company [employer] [phone] is prescription plan.

## 2020-02-19 ENCOUNTER — Other Ambulatory Visit: Payer: Self-pay | Admitting: Registered Nurse

## 2020-02-19 DIAGNOSIS — R351 Nocturia: Secondary | ICD-10-CM

## 2020-12-01 ENCOUNTER — Other Ambulatory Visit: Payer: Self-pay | Admitting: Registered Nurse

## 2020-12-01 DIAGNOSIS — N529 Male erectile dysfunction, unspecified: Secondary | ICD-10-CM

## 2020-12-01 NOTE — Telephone Encounter (Signed)
   Notes to clinic Is this patient associated with your practice? No PCP listed, however, Kateri Plummer, NP wrote this rx.

## 2020-12-08 ENCOUNTER — Other Ambulatory Visit: Payer: Self-pay | Admitting: Registered Nurse

## 2020-12-08 DIAGNOSIS — N529 Male erectile dysfunction, unspecified: Secondary | ICD-10-CM

## 2020-12-09 NOTE — Telephone Encounter (Signed)
Requested medications are due for refill today yes  Requested medications are on the active medication list yes  Last refill 11/16  Last visit 2020  Future visit scheduled no  Notes to clinic Has already had a curtesy refill and there is no upcoming appointment scheduled.Also no PCP listed, Ivan Plummer NP did write last rx

## 2020-12-12 NOTE — Telephone Encounter (Signed)
Needs OV not seen since 11/2019

## 2020-12-12 NOTE — Telephone Encounter (Signed)
Left message on patient's vmail to schedule appointment for med refill.

## 2020-12-13 ENCOUNTER — Telehealth: Payer: Self-pay | Admitting: Registered Nurse

## 2020-12-13 NOTE — Telephone Encounter (Signed)
Called pt to find out what he needs, pt was exasperated and stated he simply needed to speak to Gerlene Burdock I asked for context as Gerlene Burdock is busy with pr in office and has not seen him for over a year would need to jog his memory pt then said he needed a refill I replied we needed to make an appointment since it has been so long he proceeded to say he needs a physical as well I scheduled acute for med refill 12/20/2020 and physical 02/22/21 pt was very combative through conversation saying I was making things complicated by asking so many questions (I had asked what days of the week or time of day works for him to make an appointment and if he wanted his labs prior to his physical or day of) Pt proceeded to escalate yelling that we are just like the government and we want too much information and were unreasonable I apologized that he was feeling that way but assured him I was not trying to be difficult but that I am trying to ask these things to ensure he got an appointment that would work best for him and that he was getting everything he needed while I was speaking with him today. After scheduling he hung up.

## 2020-12-13 NOTE — Telephone Encounter (Signed)
Pt called and stated he would like a call from Janeece Agee today. Please advise.

## 2020-12-19 ENCOUNTER — Other Ambulatory Visit: Payer: Self-pay

## 2020-12-19 ENCOUNTER — Encounter: Payer: Self-pay | Admitting: Registered Nurse

## 2020-12-19 ENCOUNTER — Ambulatory Visit: Payer: PRIVATE HEALTH INSURANCE | Admitting: Registered Nurse

## 2020-12-19 VITALS — BP 145/96 | HR 63 | Temp 97.6°F | Resp 16 | Ht 74.0 in | Wt 190.0 lb

## 2020-12-19 DIAGNOSIS — N529 Male erectile dysfunction, unspecified: Secondary | ICD-10-CM | POA: Diagnosis not present

## 2020-12-19 DIAGNOSIS — Z23 Encounter for immunization: Secondary | ICD-10-CM | POA: Diagnosis not present

## 2020-12-19 MED ORDER — TADALAFIL 20 MG PO TABS: 20.0000 mg | ORAL_TABLET | Freq: Every day | ORAL | 6 refills | Status: DC | PRN

## 2020-12-19 NOTE — Progress Notes (Signed)
Established Patient Office Visit  Subjective:  Patient ID: Ivan Blackwell, male    DOB: Apr 25, 1961  Age: 60 y.o. MRN: [id number]  CC:  Chief Complaint  Patient presents with  . Medication Refill    Tadalafil    HPI [name] presents for med refill and vaccine counseling  Med refill; Tadalafil - uses every 2-3 days with good effect. No AEs. Feeling well overall. Hopes to continue. Rx expired.  Vaccine counseling: rec'd first dose of covid vaccine. Left before receiving second - admits to faked documentation to satisfy insurance vaccine requirement. Now has to get another shot - concerned regarding timing of first shot vs. Documented second shot vs. Now needing booster, as well as overall vaccine safety and efficacy.   Past Medical History:  Diagnosis Date  . Allergy    SEASONAL  . Medical history non-contributory     Past Surgical History:  Procedure Laterality Date  . ANTERIOR CERVICAL DECOMP/DISCECTOMY FUSION N/A 07/29/2013   Procedure: ANTERIOR CERVICAL DECOMPRESSION/DISCECTOMY FUSION PLATING BONEGRAFT CERVICAL SIX-SEVEN;  Surgeon: Carmela Hurt, MD;  Location: [facility];  Service: Neurosurgery;  Laterality: N/A;    Family History  Problem Relation Age of Onset  . Colon cancer Neg Hx     Social History   Socioeconomic History  . Marital status: Married    Spouse name: Not on file  . Number of children: Not on file  . Years of education: Not on file  . Highest education level: Not on file  Occupational History  . Not on file  Tobacco Use  . Smoking status: Former Smoker    Packs/day: 1.00    Years: 20.00    Pack years: 20.00    Types: Cigarettes    Quit date: 12/17/2005    Years since quitting: 15.0  . Smokeless tobacco: Never Used  . Tobacco comment: Quit  Aug 2007  Substance and Sexual Activity  . Alcohol use: Yes    Comment: occ. "during football season"  2-3 drinks on Sunday per pt.   . Drug use: No  . Sexual activity: Yes    Birth  control/protection: None  Other Topics Concern  . Not on file  Social History Narrative  . Not on file   Social Determinants of Health   Financial Resource Strain: Not on file  Food Insecurity: Not on file  Transportation Needs: Not on file  Physical Activity: Not on file  Stress: Not on file  Social Connections: Not on file  Intimate Partner Violence: Not on file    Outpatient Medications Prior to Visit  Medication Sig Dispense Refill  . Multiple Vitamin (MULTIVITAMIN) tablet Take 1 tablet by mouth daily.    . tadalafil (CIALIS) 20 MG tablet Take 1 tablet (20 mg total) by mouth daily as needed for erectile dysfunction. 30 tablet 11  . doxazosin (CARDURA) 1 MG tablet TAKE 1 TABLET BY MOUTH EVERY DAY (Patient not taking: Reported on 12/19/2020) 90 tablet 0  . meloxicam (MOBIC) 15 MG tablet Take 1 tablet (15 mg total) by mouth daily. (Patient not taking: Reported on 12/19/2020) 30 tablet 0  . methocarbamol (ROBAXIN) 500 MG tablet 1 po q 8 hrs prn spasms (Patient not taking: Reported on 12/19/2020) 30 tablet 0  . sildenafil (VIAGRA) 50 MG tablet Take 1 tablet (50 mg total) by mouth daily as needed for erectile dysfunction. (Patient not taking: Reported on 12/19/2020) 10 tablet 0  . traMADol (ULTRAM) 50 MG tablet 1 po q 12hrs prn pain (  Patient not taking: Reported on 12/19/2020) 25 tablet 0   No facility-administered medications prior to visit.    No Known Allergies  ROS Review of Systems  Constitutional: Negative.   HENT: Negative.   Eyes: Negative.   Respiratory: Negative.   Cardiovascular: Negative.   Gastrointestinal: Negative.   Genitourinary: Negative.   Musculoskeletal: Negative.   Skin: Negative.   Neurological: Negative.   Psychiatric/Behavioral: Negative.   All other systems reviewed and are negative.     Objective:    Physical Exam Constitutional:      General: He is not in acute distress.    Appearance: Normal appearance. He is normal weight. He is not  ill-appearing, toxic-appearing or diaphoretic.  Cardiovascular:     Rate and Rhythm: Normal rate and regular rhythm.     Heart sounds: Normal heart sounds. No murmur heard. No friction rub. No gallop.   Pulmonary:     Effort: Pulmonary effort is normal. No respiratory distress.     Breath sounds: Normal breath sounds. No stridor. No wheezing, rhonchi or rales.  Chest:     Chest wall: No tenderness.  Neurological:     General: No focal deficit present.     Mental Status: He is alert and oriented to person, place, and time. Mental status is at baseline.  Psychiatric:        Mood and Affect: Mood normal.        Behavior: Behavior normal.        Thought Content: Thought content normal.        Judgment: Judgment normal.     BP (!) 145/96   Pulse 63   Temp 97.6 F (36.4 C) (Temporal)   Resp 16   Ht 6\' 2"  (1.88 m)   Wt 190 lb (86.2 kg)   SpO2 100%   BMI 24.39 kg/m  Wt Readings from Last 3 Encounters:  12/19/20 190 lb (86.2 kg)  11/27/19 192 lb 6.4 oz (87.3 kg)  06/12/18 182 lb 3.2 oz (82.6 kg)     There are no preventive care reminders to display for this patient.  There are no preventive care reminders to display for this patient.  Lab Results  Component Value Date   TSH 2.750 11/27/2019   Lab Results  Component Value Date   WBC 6.3 11/27/2019   HGB 15.5 11/27/2019   HCT 46.4 11/27/2019   MCV 91 11/27/2019   PLT 219 11/27/2019   Lab Results  Component Value Date   NA 139 11/27/2019   K 4.7 11/27/2019   CO2 23 11/27/2019   GLUCOSE 85 11/27/2019   BUN 16 11/27/2019   CREATININE 1.03 11/27/2019   BILITOT 0.4 11/27/2019   ALKPHOS 66 11/27/2019   AST 25 11/27/2019   ALT 22 11/27/2019   PROT 7.0 11/27/2019   ALBUMIN 4.6 11/27/2019   CALCIUM 9.1 11/27/2019   Lab Results  Component Value Date   CHOL 176 11/27/2019   Lab Results  Component Value Date   HDL 43 11/27/2019   Lab Results  Component Value Date   LDLCALC 102 (H) 11/27/2019   Lab Results   Component Value Date   TRIG 179 (H) 11/27/2019   Lab Results  Component Value Date   CHOLHDL 4.1 11/27/2019   Lab Results  Component Value Date   HGBA1C 5.1 11/27/2019      Assessment & Plan:   Problem List Items Addressed This Visit   None   Visit Diagnoses    Need for  prophylactic vaccination and inoculation against influenza    -  Primary   Relevant Orders   Flu Vaccine QUAD 36+ mos IM (Completed)   Erectile dysfunction, unspecified erectile dysfunction type       Relevant Medications   tadalafil (CIALIS) 20 MG tablet      Meds ordered this encounter  Medications  . tadalafil (CIALIS) 20 MG tablet    Sig: Take 1 tablet (20 mg total) by mouth daily as needed for erectile dysfunction.    Dispense:  60 tablet    Refill:  6    Order Specific Question:   Supervising Provider    Answer:   Carlota Raspberry, JEFFREY R [[id number]]    Follow-up: No follow-ups on file.   PLAN  Refill tadalafil  Discussed vaccine safety and efficacy with patient. Discussed the concerns regarding forging medical documentation. Encouraged vaccination.  Return in 1 year, sooner with concerns.  Patient encouraged to call clinic with any questions, comments, or concerns.  I spent 36 minutes with this patient, more than 50% of which was spent counseling and/or educating.  Maximiano Coss, NP

## 2020-12-19 NOTE — Patient Instructions (Signed)
° ° ° °  If you have lab work done today you will be contacted with your lab results within the next 2 weeks.  If you have not heard from us then please contact us. The fastest way to get your results is to register for My Chart. ° ° °IF you received an x-ray today, you will receive an invoice from [facility]. Please contact [facility] at [phone] with questions or concerns regarding your invoice.  ° °IF you received labwork today, you will receive an invoice from [facility]. Please contact [facility] at [phone] with questions or concerns regarding your invoice.  ° °Our billing staff will not be able to assist you with questions regarding bills from these companies. ° °You will be contacted with the lab results as soon as they are available. The fastest way to get your results is to activate your My Chart account. Instructions are located on the last page of this paperwork. If you have not heard from us regarding the results in 2 weeks, please contact this office. °  ° ° ° °

## 2021-02-21 ENCOUNTER — Ambulatory Visit: Payer: PRIVATE HEALTH INSURANCE | Admitting: Family Medicine

## 2021-02-21 ENCOUNTER — Encounter: Payer: Self-pay | Admitting: Family Medicine

## 2021-02-21 ENCOUNTER — Other Ambulatory Visit: Payer: Self-pay

## 2021-02-21 VITALS — BP 114/80 | HR 68 | Temp 98.0°F | Ht 74.0 in | Wt 186.0 lb

## 2021-02-21 DIAGNOSIS — R350 Frequency of micturition: Secondary | ICD-10-CM

## 2021-02-21 DIAGNOSIS — Z0001 Encounter for general adult medical examination with abnormal findings: Secondary | ICD-10-CM | POA: Diagnosis not present

## 2021-02-21 DIAGNOSIS — Z0000 Encounter for general adult medical examination without abnormal findings: Secondary | ICD-10-CM

## 2021-02-21 NOTE — Progress Notes (Signed)
3/8/202212:08 PM  Ivan Blackwell 1961/08/14, 60 y.o., male [id number]  Chief Complaint  Patient presents with  . Annual Exam    Patient has questions about BP , has been checking bp at [facility] and it indicated they are high in the 120' /80's  . Nocturia    Gets up at night about 4 times to urinate     HPI:   Patient is a 60 y.o. male with past medical history significant for HLD who presents today for annual physical.  Suzie Portela has a machine that stated he had high BP 122/82, he is concerned about his number BP at goal< 130/80 BP Readings from Last 3 Encounters:  02/21/21 114/80  12/19/20 (!) 145/96  11/27/19 118/84     Overall eats a healthy diet Has been trying to increase beans and vegetables He occasionally eats raw meats  Doesn't have the energy he had in the past Occasionally smokes marijuana Smoked cigarettes in the past  Has issues with urination Frequency during the night especially This makes it hard for him to sleep Feels it is progressively getting worse  Takes cialis for ED, this works well  Is married and lives with wife   Health Maintenance  Topic Date Due  . COVID-19 Vaccine (1) Never done  . TETANUS/TDAP  03/25/2024  . COLONOSCOPY (Pts 45-19yr Insurance coverage will need to be confirmed)  05/17/2024  . INFLUENZA VACCINE  Completed  . Hepatitis C Screening  Completed  . HIV Screening  Completed  . HPV VACCINES  Aged Out     [facility] 12/19/2020 11/27/2019 06/12/2018  Decreased Interest 0 0 0  Down, Depressed, Hopeless 0 0 0  PHQ - 2 Score 0 0 0  Altered sleeping - 1 -  Tired, decreased energy - 0 -  Change in appetite - 0 -  Feeling bad or failure about yourself  - 0 -  Trouble concentrating - 0 -  Moving slowly or fidgety/restless - 0 -  Suicidal thoughts - 0 -  PHQ-9 Score - 1 -  Difficult doing work/chores - Not difficult at all -    Fall Risk  02/21/2021 12/19/2020 11/27/2019 06/12/2018 04/07/2018  Falls in the past  year? 0 0 0 No No  Number falls in past yr: 0 - 0 - -  Injury with Fall? 0 - 0 - -  Follow up Falls evaluation completed Falls evaluation completed Falls evaluation completed - -     No Known Allergies  Prior to Admission medications   Medication Sig Start Date End Date Taking? Authorizing Provider  Multiple Vitamin (MULTIVITAMIN) tablet Take 1 tablet by mouth daily.   Yes [provider]  tadalafil (CIALIS) 20 MG tablet Take 1 tablet (20 mg total) by mouth daily as needed for erectile dysfunction. 12/19/20  Yes MMaximiano Coss NP    Past Medical History:  Diagnosis Date  . Allergy    SEASONAL  . Medical history non-contributory     Past Surgical History:  Procedure Laterality Date  . ANTERIOR CERVICAL DECOMP/DISCECTOMY FUSION N/A 07/29/2013   Procedure: ANTERIOR CERVICAL DECOMPRESSION/DISCECTOMY FUSION PLATING BONEGRAFT CERVICAL SIX-SEVEN;  Surgeon: KWinfield Cunas MD;  [facility];  Service: Neurosurgery;  Laterality: N/A;    Social History   Tobacco Use  . Smoking status: Former Smoker    Packs/day: 1.00    Years: 20.00    Pack years: 20.00    Types: Cigarettes    Quit date: 12/17/2005  Years since quitting: 15.1  . Smokeless tobacco: Never Used  . Tobacco comment: Quit  Aug 2007  Substance Use Topics  . Alcohol use: Yes    Comment: occ. "during football season"  2-3 drinks on Sunday per pt.     Family History  Problem Relation Age of Onset  . Colon cancer Neg Hx     Review of Systems  Constitutional: Positive for malaise/fatigue. Negative for chills and fever.  Eyes: Negative for blurred vision and double vision.  Respiratory: Negative for cough, shortness of breath and wheezing.   Cardiovascular: Negative for chest pain, palpitations and leg swelling.  Gastrointestinal: Negative for abdominal pain, blood in stool, constipation, diarrhea, heartburn, nausea and vomiting.  Genitourinary: Positive for frequency. Negative for dysuria, flank  pain, hematuria and urgency.       Nocturia, weak stream: progressively getting worse  Musculoskeletal: Negative for back pain and joint pain.  Skin: Negative for rash.  Neurological: Negative for dizziness, weakness and headaches.     OBJECTIVE:  Today's Vitals   02/21/21 1110  BP: 114/80  Pulse: 68  Temp: 98 F (36.7 C)  SpO2: 96%  Weight: 186 lb (84.4 kg)  Height: '6\' 2"'  (1.88 m)   Body mass index is 23.88 kg/m.   Physical Exam Constitutional:      General: He is not in acute distress.    Appearance: Normal appearance. He is normal weight. He is not ill-appearing.  HENT:     Head: Normocephalic.     Right Ear: Tympanic membrane and ear canal normal.     Left Ear: Tympanic membrane and ear canal normal.     Nose: Nose normal.     Mouth/Throat:     Mouth: Mucous membranes are moist.     Dentition: Normal dentition.     Pharynx: Oropharynx is clear. No pharyngeal swelling, oropharyngeal exudate or posterior oropharyngeal erythema.  Eyes:     Extraocular Movements: Extraocular movements intact.     Conjunctiva/sclera: Conjunctivae normal.     Pupils: Pupils are equal, round, and reactive to light.  Neck:     Thyroid: No thyroid mass, thyromegaly or thyroid tenderness.  Cardiovascular:     Rate and Rhythm: Normal rate and regular rhythm.     Pulses: Normal pulses.     Heart sounds: Normal heart sounds. No murmur heard. No friction rub. No gallop.   Pulmonary:     Effort: Pulmonary effort is normal. No respiratory distress.     Breath sounds: Normal breath sounds.  Abdominal:     General: Bowel sounds are normal.     Palpations: Abdomen is soft.     Tenderness: There is no abdominal tenderness.     Hernia: No hernia is present.  Musculoskeletal:        General: No tenderness, deformity or signs of injury. Normal range of motion.     Cervical back: Normal range of motion.  Lymphadenopathy:     Cervical: No cervical adenopathy.  Skin:    General: Skin is warm  and dry.  Neurological:     Mental Status: He is alert and oriented to person, place, and time.  Psychiatric:        Mood and Affect: Mood normal.        Behavior: Behavior normal.     No results found for this or any previous visit (from the past 24 hour(s)).  No results found.   ASSESSMENT and PLAN  Problem List Items Addressed This Visit  None   Visit Diagnoses    Annual physical exam    -  Primary   Relevant Orders   CMP14+EGFR   CBC with Differential   Lipid Panel   TSH   Vitamin D, 25-hydroxy   Hemoglobin A1c   PSA   Urinary frequency       Relevant Orders   Ambulatory referral to Urology      Plan . Will follow up with lab results . Referral placed to urology   Return in about 1 year (around 02/21/2022).    Huston Foley Tuvia Woodrick, FNP-BC [facility] [place], [place] [address] Ph.  [phone] Fax [phone]

## 2021-02-21 NOTE — Patient Instructions (Addendum)
Health Maintenance, Male Adopting a healthy lifestyle and getting preventive care are important in promoting health and wellness. Ask your health care provider about:  The right schedule for you to have regular tests and exams.  Things you can do on your own to prevent diseases and keep yourself healthy. What should I know about diet, weight, and exercise? Eat a healthy diet  Eat a diet that includes plenty of vegetables, fruits, low-fat dairy products, and lean protein.  Do not eat a lot of foods that are high in solid fats, added sugars, or sodium.   Maintain a healthy weight Body mass index (BMI) is a measurement that can be used to identify possible weight problems. It estimates body fat based on height and weight. Your health care provider can help determine your BMI and help you achieve or maintain a healthy weight. Get regular exercise Get regular exercise. This is one of the most important things you can do for your health. Most adults should:  Exercise for at least 150 minutes each week. The exercise should increase your heart rate and make you sweat (moderate-intensity exercise).  Do strengthening exercises at least twice a week. This is in addition to the moderate-intensity exercise.  Spend less time sitting. Even light physical activity can be beneficial. Watch cholesterol and blood lipids Have your blood tested for lipids and cholesterol at 58 years of age, then have this test every 5 years. You may need to have your cholesterol levels checked more often if:  Your lipid or cholesterol levels are high.  You are older than 58 years of age.  You are at high risk for heart disease. What should I know about cancer screening? Many types of cancers can be detected early and may often be prevented. Depending on your health history and family history, you may need to have cancer screening at various ages. This may include screening for:  Colorectal cancer.  Prostate  cancer.  Skin cancer.  Lung cancer. What should I know about heart disease, diabetes, and high blood pressure? Blood pressure and heart disease  High blood pressure causes heart disease and increases the risk of stroke. This is more likely to develop in people who have high blood pressure readings, are of African descent, or are overweight.  Talk with your health care provider about your target blood pressure readings.  Have your blood pressure checked: ? Every 3-5 years if you are 18-39 years of age. ? Every year if you are 40 years old or older.  If you are between the ages of 65 and 75 and are a current or former smoker, ask your health care provider if you should have a one-time screening for abdominal aortic aneurysm (AAA). Diabetes Have regular diabetes screenings. This checks your fasting blood sugar level. Have the screening done:  Once every three years after age 45 if you are at a normal weight and have a low risk for diabetes.  More often and at a younger age if you are overweight or have a high risk for diabetes. What should I know about preventing infection? Hepatitis B If you have a higher risk for hepatitis B, you should be screened for this virus. Talk with your health care provider to find out if you are at risk for hepatitis B infection. Hepatitis C Blood testing is recommended for:  Everyone born from 1945 through 1965.  Anyone with known risk factors for hepatitis C. Sexually transmitted infections (STIs)  You should be screened each   year for STIs, including gonorrhea and chlamydia, if: ? You are sexually active and are younger than 58 years of age. ? You are older than 58 years of age and your health care provider tells you that you are at risk for this type of infection. ? Your sexual activity has changed since you were last screened, and you are at increased risk for chlamydia or gonorrhea. Ask your health care provider if you are at risk.  Ask your  health care provider about whether you are at high risk for HIV. Your health care provider may recommend a prescription medicine to help prevent HIV infection. If you choose to take medicine to prevent HIV, you should first get tested for HIV. You should then be tested every 3 months for as long as you are taking the medicine. Follow these instructions at home: Lifestyle  Do not use any products that contain nicotine or tobacco, such as cigarettes, e-cigarettes, and chewing tobacco. If you need help quitting, ask your health care provider.  Do not use street drugs.  Do not share needles.  Ask your health care provider for help if you need support or information about quitting drugs. Alcohol use  Do not drink alcohol if your health care provider tells you not to drink.  If you drink alcohol: ? Limit how much you have to 0-2 drinks a day. ? Be aware of how much alcohol is in your drink. In the [place] one drink equals one 12 oz bottle of beer (355 mL), one 5 oz glass of wine (148 mL), or one 1 oz glass of hard liquor (44 mL). General instructions  Schedule regular health, dental, and eye exams.  Stay current with your vaccines.  Tell your health care provider if: ? You often feel depressed. ? You have ever been abused or do not feel safe at home. Summary  Adopting a healthy lifestyle and getting preventive care are important in promoting health and wellness.  Follow your health care provider's instructions about healthy diet, exercising, and getting tested or screened for diseases.  Follow your health care provider's instructions on monitoring your cholesterol and blood pressure. This information is not intended to replace advice given to you by your health care provider. Make sure you discuss any questions you have with your health care provider. Document Revised: 11/26/2018 Document Reviewed: 11/26/2018 Elsevier Patient Education  2021 Elsevier Inc.     If you have lab work  done today you will be contacted with your lab results within the next 2 weeks.  If you have not heard from us then please contact us. The fastest way to get your results is to register for My Chart.   IF you received an x-ray today, you will receive an invoice from [facility]. Please contact [facility] at [phone] with questions or concerns regarding your invoice.   IF you received labwork today, you will receive an invoice from [facility]. Please contact [facility] at [phone] with questions or concerns regarding your invoice.   Our billing staff will not be able to assist you with questions regarding bills from these companies.  You will be contacted with the lab results as soon as they are available. The fastest way to get your results is to activate your My Chart account. Instructions are located on the last page of this paperwork. If you have not heard from us regarding the results in 2 weeks, please contact this office.      

## 2021-02-22 ENCOUNTER — Ambulatory Visit (INDEPENDENT_AMBULATORY_CARE_PROVIDER_SITE_OTHER): Payer: PRIVATE HEALTH INSURANCE | Admitting: Family Medicine

## 2021-02-22 ENCOUNTER — Encounter: Payer: PRIVATE HEALTH INSURANCE | Admitting: Registered Nurse

## 2021-02-22 DIAGNOSIS — Z0000 Encounter for general adult medical examination without abnormal findings: Secondary | ICD-10-CM

## 2021-02-23 ENCOUNTER — Telehealth: Payer: Self-pay

## 2021-02-23 LAB — CBC WITH DIFFERENTIAL/PLATELET
Basophils Absolute: 0 10*3/uL (ref 0.0–0.2)
Basos: 1 %
EOS (ABSOLUTE): 0.2 10*3/uL (ref 0.0–0.4)
Eos: 3 %
Hematocrit: 44.6 % (ref 37.5–51.0)
Hemoglobin: 15.5 g/dL (ref 13.0–17.7)
Immature Grans (Abs): 0 10*3/uL (ref 0.0–0.1)
Immature Granulocytes: 0 %
Lymphocytes Absolute: 1.5 10*3/uL (ref 0.7–3.1)
Lymphs: 24 %
MCH: 30.8 pg (ref 26.6–33.0)
MCHC: 34.8 g/dL (ref 31.5–35.7)
MCV: 89 fL (ref 79–97)
Monocytes Absolute: 0.4 10*3/uL (ref 0.1–0.9)
Monocytes: 7 %
Neutrophils Absolute: 4 10*3/uL (ref 1.4–7.0)
Neutrophils: 65 %
Platelets: 245 10*3/uL (ref 150–450)
RBC: 5.04 x10E6/uL (ref 4.14–5.80)
RDW: 12.7 % (ref 11.6–15.4)
WBC: 6.1 10*3/uL (ref 3.4–10.8)

## 2021-02-23 LAB — CMP14+EGFR
ALT: 17 IU/L (ref 0–44)
AST: 21 IU/L (ref 0–40)
Albumin/Globulin Ratio: 1.7 (ref 1.2–2.2)
Albumin: 4.7 g/dL (ref 3.8–4.9)
Alkaline Phosphatase: 68 IU/L (ref 44–121)
BUN/Creatinine Ratio: 16 (ref 9–20)
BUN: 18 mg/dL (ref 6–24)
Bilirubin Total: 0.9 mg/dL (ref 0.0–1.2)
CO2: 20 mmol/L (ref 20–29)
Calcium: 9.6 mg/dL (ref 8.7–10.2)
Chloride: 104 mmol/L (ref 96–106)
Creatinine, Ser: 1.11 mg/dL (ref 0.76–1.27)
Globulin, Total: 2.7 g/dL (ref 1.5–4.5)
Glucose: 96 mg/dL (ref 65–99)
Potassium: 4.9 mmol/L (ref 3.5–5.2)
Sodium: 139 mmol/L (ref 134–144)
Total Protein: 7.4 g/dL (ref 6.0–8.5)
eGFR: 77 mL/min/{1.73_m2} (ref >59)

## 2021-02-23 LAB — HEMOGLOBIN A1C
Est. average glucose Bld gHb Est-mCnc: 100 mg/dL
Hgb A1c MFr Bld: 5.1 % (ref 4.8–5.6)

## 2021-02-23 LAB — TSH: TSH: 2.61 u[IU]/mL (ref 0.450–4.500)

## 2021-02-23 LAB — LIPID PANEL
Chol/HDL Ratio: 5.1 ratio — ABNORMAL HIGH (ref 0.0–5.0)
Cholesterol, Total: 187 mg/dL (ref 100–199)
HDL: 37 mg/dL — ABNORMAL LOW (ref >39)
LDL Chol Calc (NIH): 121 mg/dL — ABNORMAL HIGH (ref 0–99)
Triglycerides: 160 mg/dL — ABNORMAL HIGH (ref 0–149)
VLDL Cholesterol Cal: 29 mg/dL (ref 5–40)

## 2021-02-23 LAB — VITAMIN D 25 HYDROXY (VIT D DEFICIENCY, FRACTURES): Vit D, 25-Hydroxy: 42.5 ng/mL (ref 30.0–100.0)

## 2021-02-23 LAB — PSA: Prostate Specific Ag, Serum: 0.7 ng/mL (ref 0.0–4.0)

## 2021-02-23 NOTE — Progress Notes (Signed)
Overall labs look good. Your cholesterol levels are elevated. No medications are needed at this time, but continue to work on improving your diet and increasing exercise as able. Let me know if you would like a referral to a dietician to further discuss this.

## 2021-02-24 ENCOUNTER — Telehealth: Payer: Self-pay

## 2021-02-24 NOTE — Telephone Encounter (Signed)
Called and spoke with patient regarding complaints patient had with office and staff. I was unable to reach a resolution with patient due to repeated foul language and aggressive tone. Call ended due to patient's lack of cooperation.

## 2021-07-25 ENCOUNTER — Telehealth: Payer: Self-pay

## 2021-07-25 ENCOUNTER — Other Ambulatory Visit: Payer: Self-pay | Admitting: Registered Nurse

## 2021-07-25 DIAGNOSIS — N529 Male erectile dysfunction, unspecified: Secondary | ICD-10-CM

## 2021-07-25 MED ORDER — TADALAFIL 20 MG PO TABS: 20.0000 mg | ORAL_TABLET | Freq: Every day | ORAL | 6 refills | Status: DC | PRN

## 2021-07-25 NOTE — Telephone Encounter (Signed)
Patient called in earlier this morning and spoke with Tami.    Patient did not want to disclose information.    Tami transferred patient to Nehem[facility] Settle [facility] CMA).    Patient stated he was going to be boarding a flight this evening and needed advise on how to board the plane without getting stopped by TSA with Cialis.    Brooke informed patient that she would speak with Richard in regard and get back in touch.    Patient called again not long after.  He informed Tami that our office was giving him the run around.  He was demanding to speak directly with Janeece Agee for an immediate response and refused to give Tami any information.  I got on the phone with patient.  I explained that Gerlene Burdock was in patient care. However we would get with Richard to take care of this matter.  Patient refused to tell me, name, phone number or anything in regard to medication.   Patient was still demanding to speak directly with Richard.  Patient stated he did not trust anyone, even at our office.  Stated that he refused to get apprehended by the TSA.  Stated he did not want to get locked in a dark room and interrogated.   Stated he did not trust the police, TSA or airlines.     I asked patient if he had spoken with the airline in regard to his travels with medication?  Patient states he had not and refused to contact them stating they would either not pick up the phone or give him the run around like we were doing.  I offered to phone the airline for him.  He would not disclose to me the airline or either the airport stating it was not of my business.    I asked patient when did he first call in regard to this matter.  Patient stated he had just called this morning.  I did inform patient that we do have turn around times and asked why he waited to the day of to give our office a call, since this was a big concern for him.    Patient still demanded to speak with Gerlene Burdock and would still not  disclose a phone number, name or medication.    Patient was very upset through the whole conversation.  I informed the patient that the call was not going anywhere and that I was disconnecting.

## 2021-07-25 NOTE — Telephone Encounter (Signed)
Patient has called back in regard.  He wanted to make a complaint against the office.  States we are "screwing around with him" by not getting Richard on the phone directly when he calls.  Stated that the population should stop if our office is the example of care everyone gives.    I informed patient that it was hard for our office to assist him while he is not disclosing any information.  Patient stated he does not disclose any information to strangers and that he is very discreet.    States that Patent examiner and TSA are trained to believe what the public states as a lie.  States that the public are threats to law enforcement and TSA.  States he needs to know what to say to law enforcement and TSA if they try to detain him for narcotics.  Stated he did not want to miss his flight in regard to this.   Patient states he does not want an international incident to occur or innocent people (stating himself) to suffer.  States he would like a call back in a reasonable amount of time today before his flight and not later this afternoon.    Stated he was very stressed out in regard to this matter.   Patient stated he would give me his phone number now so Gerlene Burdock would not have an excuse to not call him.  States he can be reached at [phone].  I reassured and reiterated to the patient that we do want to assist him but that we needed time for Richard to get out of patient care.  Patient seemed to understand.

## 2021-10-08 NOTE — Telephone Encounter (Signed)
This concern has been previously addressed by myself and/or another provider.  If they patient has ongoing concerns, they can contact me at their convenience.  Thank you,  Rich Lauraann Missey, NP 

## 2021-11-17 ENCOUNTER — Emergency Department (HOSPITAL_COMMUNITY)
Admission: EM | Admit: 2021-11-17 | Discharge: 2021-11-17 | Disposition: A | Discharge status: Home / Self Care | Payer: No Typology Code available for payment source | Attending: Emergency Medicine | Admitting: Emergency Medicine

## 2021-11-17 ENCOUNTER — Encounter (HOSPITAL_COMMUNITY): Payer: Self-pay | Admitting: Emergency Medicine

## 2021-11-17 ENCOUNTER — Other Ambulatory Visit: Payer: Self-pay

## 2021-11-17 DIAGNOSIS — Z5321 Procedure and treatment not carried out due to patient leaving prior to being seen by health care provider: Secondary | ICD-10-CM | POA: Insufficient documentation

## 2021-11-17 DIAGNOSIS — R059 Cough, unspecified: Secondary | ICD-10-CM | POA: Diagnosis not present

## 2021-11-17 DIAGNOSIS — R509 Fever, unspecified: Secondary | ICD-10-CM | POA: Insufficient documentation

## 2021-11-17 DIAGNOSIS — M791 Myalgia, unspecified site: Secondary | ICD-10-CM | POA: Insufficient documentation

## 2021-11-17 NOTE — ED Notes (Signed)
Pt refused respiratory panel.

## 2021-11-17 NOTE — ED Triage Notes (Signed)
Pt c/o generalized body aches, cough, fever and being unable to sleep.

## 2021-11-17 NOTE — ED Notes (Signed)
Pt states he is leaving °

## 2021-12-19 ENCOUNTER — Encounter: Payer: Self-pay | Admitting: Registered Nurse

## 2021-12-19 ENCOUNTER — Ambulatory Visit: Payer: No Typology Code available for payment source | Admitting: Registered Nurse

## 2021-12-19 VITALS — BP 126/74 | HR 72 | Temp 98.2°F | Resp 17 | Ht 74.0 in | Wt 175.4 lb

## 2021-12-19 DIAGNOSIS — Z1159 Encounter for screening for other viral diseases: Secondary | ICD-10-CM

## 2021-12-19 DIAGNOSIS — N529 Male erectile dysfunction, unspecified: Secondary | ICD-10-CM

## 2021-12-19 DIAGNOSIS — R350 Frequency of micturition: Secondary | ICD-10-CM | POA: Diagnosis not present

## 2021-12-19 DIAGNOSIS — M545 Low back pain, unspecified: Secondary | ICD-10-CM | POA: Diagnosis not present

## 2021-12-19 MED ORDER — DICLOFENAC SODIUM 75 MG PO TBEC: 75.0000 mg | DELAYED_RELEASE_TABLET | Freq: Two times a day (BID) | ORAL | 0 refills | Status: DC

## 2021-12-19 MED ORDER — TADALAFIL 20 MG PO TABS: 20.0000 mg | ORAL_TABLET | Freq: Every day | ORAL | 6 refills | Status: DC | PRN

## 2021-12-19 MED ORDER — DOXAZOSIN MESYLATE 2 MG PO TABS: 2.0000 mg | ORAL_TABLET | Freq: Every day | ORAL | 0 refills | Status: DC

## 2021-12-19 MED ORDER — METHOCARBAMOL 500 MG PO TABS: 500.0000 mg | ORAL_TABLET | Freq: Four times a day (QID) | ORAL | 1 refills | Status: DC

## 2021-12-19 NOTE — Patient Instructions (Addendum)
Ivan Blackwell -   Ivan Blackwell to see you.   No need for Hep C screening - yours was negative in 2015.  I have sent a refill of tadalafil.  Start cardura 2mg  daily. Let me know how it goes.   Continue a healthy diet - you're doing great.  See you in a few months for a physical and labs  Thank you  [name]

## 2021-12-19 NOTE — Progress Notes (Signed)
Established Patient Office Visit  Subjective:  Patient ID: Ivan Blackwell, male    DOB: 01-04-1961  Age: 61 y.o. MRN: [id number]  CC:  Chief Complaint  Patient presents with   Back Pain    Pt reports no injury, woke up with lower back pain about 1 month ago, tried OTC creams and patches, pt reports some things have made it tolerable    Results    Pt would like to discuss how to lower prostate level reports unhelpful information from urologist, and would like Hep C rechecked last check 2015.     HPI Ivan Blackwell presents for back pain, Hep C screening, med refill, PSA level  Hep C: Last checked 2015, negative. No known exposure in interim. Would like peace of mind testing.  Med refill Takes tadalafil 24m po qd prn. Good effect, no AE. Hopes to continue.   PSA - BPH Follows with urology Dr. BAlinda Money Unfortunately unsatisfied with his care.  His last PSA on file was 0.7 with [place]- checked by KHuston FoleyJust in March 2022.  Flomax was given for urinary frequency. Notes that this worsened ED/lowered libido Hx of BPH. Wants to try to shrink his prostate. Has stopped eating red meat, cut back on carbonated beverages, minimized dairy products.   Back pain Onset 1 mo ago, no acute injury or trauma Has pursued OTC remedies with some relief.  Worse with movement - pain with bending over.  No sciatica.   Past Medical History:  Diagnosis Date   Allergy    SEASONAL   Medical history non-contributory     Past Surgical History:  Procedure Laterality Date   ANTERIOR CERVICAL DECOMP/DISCECTOMY FUSION N/A 07/29/2013   Procedure: ANTERIOR CERVICAL DECOMPRESSION/DISCECTOMY FUSION PLATING BONEGRAFT CERVICAL SIX-SEVEN;  Surgeon: KWinfield Cunas MD;  [facility];  Service: Neurosurgery;  Laterality: N/A;    Family History  Problem Relation Age of Onset   Colon cancer Neg Hx     Social History   Socioeconomic History   Marital status: Married    Spouse name: Not on file   Number  of children: Not on file   Years of education: Not on file   Highest education level: Not on file  Occupational History   Not on file  Tobacco Use   Smoking status: Former    Packs/day: 1.00    Years: 20.00    Pack years: 20.00    Types: Cigarettes    Quit date: 12/17/2005    Years since quitting: 16.0   Smokeless tobacco: Never   Tobacco comments:    Quit  Aug 2007  Substance and Sexual Activity   Alcohol use: Yes    Comment: occ. "during football season"  2-3 drinks on Sunday per pt.    Drug use: No   Sexual activity: Yes    Birth control/protection: None  Other Topics Concern   Not on file  Social History Narrative   Not on file   Social Determinants of Health   Financial Resource Strain: Not on file  Food Insecurity: Not on file  Transportation Needs: Not on file  Physical Activity: Not on file  Stress: Not on file  Social Connections: Not on file  Intimate Partner Violence: Not on file    Outpatient Medications Prior to Visit  Medication Sig Dispense Refill   Multiple Vitamin (MULTIVITAMIN) tablet Take 1 tablet by mouth daily.     tadalafil (CIALIS) 20 MG tablet Take 1 tablet (20 mg total) by  mouth daily as needed for erectile dysfunction. 60 tablet 6   No facility-administered medications prior to visit.    No Known Allergies  ROS Review of Systems Per hpi     Objective:    Physical Exam Constitutional:      General: He is not in acute distress.    Appearance: Normal appearance. He is normal weight. He is not ill-appearing, toxic-appearing or diaphoretic.  Cardiovascular:     Rate and Rhythm: Normal rate and regular rhythm.     Heart sounds: Normal heart sounds. No murmur heard.   No friction rub. No gallop.  Pulmonary:     Effort: Pulmonary effort is normal. No respiratory distress.     Breath sounds: Normal breath sounds. No stridor. No wheezing, rhonchi or rales.  Chest:     Chest wall: No tenderness.  Musculoskeletal:        General:  Tenderness (bilateral lower back) present.  Neurological:     General: No focal deficit present.     Mental Status: He is alert and oriented to person, place, and time. Mental status is at baseline.  Psychiatric:        Mood and Affect: Mood normal.        Behavior: Behavior normal.        Thought Content: Thought content normal.        Judgment: Judgment normal.    BP 126/74    Pulse 72    Temp 98.2 F (36.8 C) (Temporal)    Resp 17    Ht '6\' 2"'  (1.88 m)    Wt 175 lb 6.4 oz (79.6 kg)    SpO2 95%    BMI 22.52 kg/m  Wt Readings from Last 3 Encounters:  12/19/21 175 lb 6.4 oz (79.6 kg)  02/21/21 186 lb (84.4 kg)  12/19/20 190 lb (86.2 kg)     Health Maintenance Due  Topic Date Due   COVID-19 Vaccine (1) Never done   Zoster Vaccines- Shingrix (1 of 2) Never done   INFLUENZA VACCINE  07/17/2021    There are no preventive care reminders to display for this patient.  Lab Results  Component Value Date   TSH 2.610 02/22/2021   Lab Results  Component Value Date   WBC 6.1 02/22/2021   HGB 15.5 02/22/2021   HCT 44.6 02/22/2021   MCV 89 02/22/2021   PLT 245 02/22/2021   Lab Results  Component Value Date   NA 139 02/22/2021   K 4.9 02/22/2021   CO2 20 02/22/2021   GLUCOSE 96 02/22/2021   BUN 18 02/22/2021   CREATININE 1.11 02/22/2021   BILITOT 0.9 02/22/2021   ALKPHOS 68 02/22/2021   AST 21 02/22/2021   ALT 17 02/22/2021   PROT 7.4 02/22/2021   ALBUMIN 4.7 02/22/2021   CALCIUM 9.6 02/22/2021   EGFR 77 02/22/2021   Lab Results  Component Value Date   CHOL 187 02/22/2021   Lab Results  Component Value Date   HDL 37 (L) 02/22/2021   Lab Results  Component Value Date   LDLCALC 121 (H) 02/22/2021   Lab Results  Component Value Date   TRIG 160 (H) 02/22/2021   Lab Results  Component Value Date   CHOLHDL 5.1 (H) 02/22/2021   Lab Results  Component Value Date   HGBA1C 5.1 02/22/2021      Assessment & Plan:   Problem List Items Addressed This Visit    None Visit Diagnoses     Urinary frequency    -  Primary   Relevant Medications   doxazosin (CARDURA) 2 MG tablet   Erectile dysfunction, unspecified erectile dysfunction type       Relevant Medications   tadalafil (CIALIS) 20 MG tablet   Encounter for hepatitis C screening test for low risk patient       Acute bilateral low back pain without sciatica       Relevant Medications   diclofenac (VOLTAREN) 75 MG EC tablet   methocarbamol (ROBAXIN) 500 MG tablet   Other Relevant Orders   DG Lumbar Spine Complete       Meds ordered this encounter  Medications   tadalafil (CIALIS) 20 MG tablet    Sig: Take 1 tablet (20 mg total) by mouth daily as needed for erectile dysfunction.    Dispense:  90 tablet    Refill:  6    Order Specific Question:   Supervising Provider    Answer:   Carlota Raspberry, JEFFREY R [[id number]]   doxazosin (CARDURA) 2 MG tablet    Sig: Take 1 tablet (2 mg total) by mouth daily.    Dispense:  90 tablet    Refill:  0    Order Specific Question:   Supervising Provider    Answer:   Carlota Raspberry, JEFFREY R [[id number]]   diclofenac (VOLTAREN) 75 MG EC tablet    Sig: Take 1 tablet (75 mg total) by mouth 2 (two) times daily.    Dispense:  30 tablet    Refill:  0    Order Specific Question:   Supervising Provider    Answer:   Carlota Raspberry, JEFFREY R [[id number]]   methocarbamol (ROBAXIN) 500 MG tablet    Sig: Take 1 tablet (500 mg total) by mouth 4 (four) times daily.    Dispense:  60 tablet    Refill:  1    Order Specific Question:   Supervising Provider    Answer:   Carlota Raspberry, JEFFREY R [[id number]]    Follow-up: Return in about 3 months (around 03/19/2022) for CPE and labs .   PLAN Diclofenac and methocarbamol for back pain. Will order dg lumbar spine. Consider PT. Encourage daily stretching.  Had Hep C screening in 2015 - negative - no risk - will not repeat.  Refill tadalafil.  Start cardura for BPH. If any AE, stop taking - he will let me know. Return in 3 mo for CPE and labs.  Maximiano Coss,  NP

## 2021-12-20 ENCOUNTER — Ambulatory Visit
Admission: RE | Admit: 2021-12-20 | Discharge: 2021-12-20 | Disposition: A | Discharge status: Home / Self Care | Payer: No Typology Code available for payment source | Source: Ambulatory Visit | Attending: Registered Nurse | Admitting: Registered Nurse

## 2021-12-20 ENCOUNTER — Other Ambulatory Visit: Payer: Self-pay

## 2021-12-20 DIAGNOSIS — M545 Low back pain, unspecified: Secondary | ICD-10-CM

## 2021-12-20 NOTE — Progress Notes (Signed)
Can call Mr. Fabiano -   We are seeing moderate degenerative changes. I would recommend physical therapy. If he is interested, I can place a referral.  Thank you,  Rich

## 2021-12-27 NOTE — Progress Notes (Signed)
Patient has been transferred back to be me to discuss his lab results . Patient stated really started to get upset and stated that people from the [facility] setting are not honest. I told him that I would speak with rich if he didn't think I was being honest and patient said he needed a call back today.

## 2021-12-28 ENCOUNTER — Telehealth: Payer: Self-pay

## 2021-12-28 ENCOUNTER — Encounter: Payer: Self-pay | Admitting: *Deleted

## 2021-12-28 NOTE — Telephone Encounter (Signed)
Patient called into the office yesterday 12-27-2021 to discuss his results for imaging. Call was transferred to me and I explained the results that PCP wrote out. Patient stated that the Government and Healthcare seem to have got bad about not being transparent and seem to lie now a days. Pt was very rude and I explained to him that I would try to speak with Kateri Plummer about him saying that he would like clarification also assured him I had no reason to be dishonest about anything regarding his health. Patient wanted to be called As soon as possible even after being told he was in clinic and I would make sure we have a discussion he said ok and hung up on me.

## 2021-12-29 NOTE — Telephone Encounter (Signed)
Pt called in on 12/28/21 and asked to speak with the office manager. I checked with and she was in a meeting. I explained to the pt that the manager was in a meeting and he begin to cuss me and GD this and GD that. He has been lied to and not phone calls are ever returned and not one will talk to him. He blames the government and the healthcare system and banks. I asked the pt for his DOB and name he goes off that if he give that I will single him out and treat him different. He wanted to know the results from him labs and that no one will give it to him. When I asked him again for his name and DOB to see if I could find notes he begain to cuss me again and calling me stupid and I was doing it on purpose that I could not find him in the system. I axplained to him I was trying to help him but if he was going to continue to cuss me I will hang up. I finally was able to find the pt in the system and read the notes that Richard had provided and he said what does that mean I said sir he asked would you like for a referral to be done. He then goes on to put me and the practice down by this time we had been on the phone for 6 min. He then called me a B**ch I then ended the call.

## 2022-02-20 ENCOUNTER — Encounter (HOSPITAL_BASED_OUTPATIENT_CLINIC_OR_DEPARTMENT_OTHER): Payer: Self-pay | Admitting: Family Medicine

## 2022-02-20 ENCOUNTER — Ambulatory Visit (HOSPITAL_BASED_OUTPATIENT_CLINIC_OR_DEPARTMENT_OTHER): Payer: No Typology Code available for payment source | Admitting: Family Medicine

## 2022-02-20 ENCOUNTER — Other Ambulatory Visit: Payer: Self-pay

## 2022-02-20 DIAGNOSIS — L918 Other hypertrophic disorders of the skin: Secondary | ICD-10-CM | POA: Diagnosis not present

## 2022-02-20 DIAGNOSIS — M545 Low back pain, unspecified: Secondary | ICD-10-CM | POA: Diagnosis not present

## 2022-02-20 DIAGNOSIS — Z0000 Encounter for general adult medical examination without abnormal findings: Secondary | ICD-10-CM

## 2022-02-20 DIAGNOSIS — N529 Male erectile dysfunction, unspecified: Secondary | ICD-10-CM

## 2022-02-20 DIAGNOSIS — L989 Disorder of the skin and subcutaneous tissue, unspecified: Secondary | ICD-10-CM | POA: Insufficient documentation

## 2022-02-20 MED ORDER — TADALAFIL 20 MG PO TABS: 20.0000 mg | ORAL_TABLET | Freq: Every day | ORAL | 1 refills | Status: DC | PRN

## 2022-02-20 NOTE — Patient Instructions (Signed)
?  Medication Instructions:  ?Your physician recommends that you continue on your current medications as directed. Please refer to the Current Medication list given to you today. ?--If you need a refill on any your medications before your next appointment, please call your pharmacy first. If no refills are authorized on file call the office.-- ?Lab Work: ?Your physician has recommended that you have lab work today: 1 week prior to CPE  ?If you have labs (blood work) drawn today and your tests are completely normal, you will receive your results via MyChart message OR a phone call from our staff.  ?Please ensure you check your voicemail in the event that you authorized detailed messages to be left on a delegated number. If you have any lab test that is abnormal or we need to change your treatment, we will call you to review the results. ? ?Referrals/Procedures/Imaging: ?Referral to dermatology ? ?Follow-Up: ?Your next appointment:   ?Your physician recommends that you schedule a follow-up appointment in: 2 months CPE, labs 1 week prior to appointment with Dr. Tommi Rumps [place] ? ?You will receive a text message or e-mail with a link to a survey about your care and experience with [place] today! We would greatly appreciate your feedback!  ? ?Thanks for letting us be apart of your health journey!!  ?Primary Care and Sports Medicine  ? ?Dr. Marcy Salvo de [place]  ? ?We encourage you to activate your patient portal called "MyChart".  Sign up information is provided on this After Visit Summary.  MyChart is used to connect with patients for Virtual Visits (Telemedicine).  Patients are able to view lab/test results, encounter notes, upcoming appointments, etc.  Non-urgent messages can be sent to your provider as well. To learn more about what you can do with MyChart, please visit --  [email or web address].   ? ?

## 2022-02-20 NOTE — Assessment & Plan Note (Signed)
Uncertain etiology, possibly consistent with wart ?Will refer to dermatology for further evaluation and recommendations, possibly can be managed with liquid nitrogen/freezing ?

## 2022-02-20 NOTE — Assessment & Plan Note (Signed)
Provided refill of Cialis today ?

## 2022-02-20 NOTE — Assessment & Plan Note (Signed)
Area inferior to left eyebrow appears consistent with skin tag ?Given location of lesion, will refer to dermatology for further evaluation and recommendations ?

## 2022-02-20 NOTE — Progress Notes (Signed)
? ?New Patient Office Visit ? ?Subjective:  ?Patient ID: Ivan Blackwell, male    DOB: 18-Aug-1961  Age: 61 y.o. MRN: [id number] ? ?CC:  ?Chief Complaint  ?Patient presents with  ? New Patient (Initial Visit)  ?  Patient presents today to establish care.He is VERY upset at his last doctor. He would like referral to dermatology for mole on left eye and skin tags on hands. He stated he has back pan and brought images. He would like labs done today   ? ? ?HPI ?Ivan Blackwell is a 61 year old male presenting to establish in clinic.  He has current concerns as outlined above.  Reports past medical history significant for BPH. ? ?BPH: Currently not taking any medications.  Have been placed on Flomax at 1 point, however had decreased libido, erectile issues when on medication.  He thus stopped the Flomax.  He has been evaluated by urology in the past (Dr. Alinda Money), but indicates that "they were a joke" and their only recommendation was for Flomax.  Patient indicates that he wants assistance with identifying ways to "shrink the prostate".  Has had PSA checked, 1 year ago it was found to be 0.7.  He is requesting refill on Cialis which he takes for erectile dysfunction ? ?Skin lesions: Patient reports having "mole" in left eyebrow that he would like to have evaluated.  He also indicates having 2 lesions over the dorsum of his hand that he is concerned about.  These lesions have been present for a couple of months.  Denies any discharge, pain, itching.  He is requesting referral to dermatology for evaluation ? ?Low back pain: Currently not having any issues, however did have an episode of pain a couple months ago and was seen by his prior PCP.  At that time he did have x-rays completed and recommendation for PT.  Pain has resolved with use of conservative measures, was also prescribed NSAID, muscle relaxer.  Patient did bring a disc with him today from imaging center which he feels has exercises and instructions on how to manage his low  back pain on the disc and would like [place] to try to pull up the information for him.  He indicates frustration with his prior PCP that supposedly they would not tell him what the results of his most recent labs or imaging were for any recommendations.  Review of chart does indicate that labs and imaging were reviewed and recommendations were provided.  Did discuss with patient the ability to sign up for MyChart which can allow patient to directly review labs and imaging results as well as communication from providers regarding results.  He indicates that he is not very good with computers and does not plan to set this up. ? ?Past Medical History:  ?Diagnosis Date  ? Allergy   ? SEASONAL  ? Medical history non-contributory   ? ? ?Past Surgical History:  ?Procedure Laterality Date  ? ANTERIOR CERVICAL DECOMP/DISCECTOMY FUSION N/A 07/29/2013  ? Procedure: ANTERIOR CERVICAL DECOMPRESSION/DISCECTOMY FUSION PLATING BONEGRAFT CERVICAL SIX-SEVEN;  Surgeon: Winfield Cunas, MD;  Location: [facility];  Service: Neurosurgery;  Laterality: N/A;  ? ? ?Family History  ?Problem Relation Age of Onset  ? Colon cancer Neg Hx   ? ? ?Social History  ? ?Socioeconomic History  ? Marital status: Married  ?  Spouse name: Not on file  ? Number of children: Not on file  ? Years of education: Not on file  ? Highest education level: Not on  file  ?Occupational History  ? Not on file  ?Tobacco Use  ? Smoking status: Former  ?  Packs/day: 1.00  ?  Years: 20.00  ?  Pack years: 20.00  ?  Types: Cigarettes  ?  Quit date: 12/17/2005  ?  Years since quitting: 16.1  ? Smokeless tobacco: Never  ? Tobacco comments:  ?  Quit  Aug 2007  ?Substance and Sexual Activity  ? Alcohol use: Yes  ?  Comment: occ. "during football season"  2-3 drinks on Sunday per pt.   ? Drug use: No  ? Sexual activity: Yes  ?  Birth control/protection: None  ?Other Topics Concern  ? Not on file  ?Social History Narrative  ? Not on file  ? ?Social Determinants of Health  ? ?Financial  Resource Strain: Not on file  ?Food Insecurity: Not on file  ?Transportation Needs: Not on file  ?Physical Activity: Not on file  ?Stress: Not on file  ?Social Connections: Not on file  ?Intimate Partner Violence: Not on file  ? ? ?Objective:  ? ?Today's Vitals: BP 122/88   Pulse 89   Ht 6\' 3"  (1.905 m)   Wt 181 lb (82.1 kg)   SpO2 96%   BMI 22.62 kg/m?  ? ?Physical Exam ? ?61 year old male in no acute distress ?Cardiovascular exam with regular rate and rhythm, no murmur appreciated ?Lungs clear to auscultation bilaterally ?Just inferior to midline of left eyebrow, patient does have a skin tag present, no obvious signs of irritation at present, no bleeding, no discharge ?Over dorsum of left hand, patient has 2 small lesions, flesh-colored, some scabbing over 1 lesion due to picking, scratching.  There is no surrounding erythema.  Lesions are dome-shaped and less than half a centimeter in diameter. ? ?Assessment & Plan:  ? ?Problem List Items Addressed This Visit   ? ?  ? Musculoskeletal and Integument  ? Acrochordon  ?  Area inferior to left eyebrow appears consistent with skin tag ?Given location of lesion, will refer to dermatology for further evaluation and recommendations ?  ?  ? Relevant Orders  ? Ambulatory referral to Dermatology  ? Skin lesion of hand  ?  Uncertain etiology, possibly consistent with wart ?Will refer to dermatology for further evaluation and recommendations, possibly can be managed with liquid nitrogen/freezing ?  ?  ? Relevant Orders  ? Ambulatory referral to Dermatology  ?  ? Other  ? Erectile dysfunction  ?  Provided refill of Cialis today ?  ?  ? Relevant Medications  ? tadalafil (CIALIS) 20 MG tablet  ? Low back pain  ?  No problems at present ?Did review results of recent imaging with patient, also pulled up information on disc brought by patient which only included imaging and radiology report ?Did provide patient with handout regarding back exercises today which patient may  perform at home to aid in resolution of symptoms as well as to help protect the back from future flares ?  ?  ? ?Other Visit Diagnoses   ? ? Wellness examination      ? Relevant Orders  ? CBC with Differential/Platelet  ? Hemoglobin A1c  ? Comprehensive metabolic panel  ? Lipid panel  ? TSH Rfx on Abnormal to Free T4  ? PSA Total (Reflex To Free)  ? ?  ? ? ?Outpatient Encounter Medications as of 02/20/2022  ?Medication Sig  ? doxazosin (CARDURA) 2 MG tablet Take 1 tablet (2 mg total) by mouth daily.  ?  Multiple Vitamin (MULTIVITAMIN) tablet Take 1 tablet by mouth daily.  ? tadalafil (CIALIS) 20 MG tablet Take 1 tablet (20 mg total) by mouth daily as needed for erectile dysfunction.  ? [DISCONTINUED] diclofenac (VOLTAREN) 75 MG EC tablet Take 1 tablet (75 mg total) by mouth 2 (two) times daily.  ? [DISCONTINUED] methocarbamol (ROBAXIN) 500 MG tablet Take 1 tablet (500 mg total) by mouth 4 (four) times daily.  ? [DISCONTINUED] tadalafil (CIALIS) 20 MG tablet Take 1 tablet (20 mg total) by mouth daily as needed for erectile dysfunction.  ? ?No facility-administered encounter medications on file as of 02/20/2022.  ? ? ?Follow-up: Return in about 2 months (around 04/22/2022).  Plan for follow-up in 1 to 2 months for CPE or sooner as needed.  We will arrange for nurse visit 1 week prior to next appointment to complete labs ? ?Aerika Groll J [place], MD ? ?

## 2022-02-20 NOTE — Assessment & Plan Note (Signed)
No problems at present ?Did review results of recent imaging with patient, also pulled up information on disc brought by patient which only included imaging and radiology report ?Did provide patient with handout regarding back exercises today which patient may perform at home to aid in resolution of symptoms as well as to help protect the back from future flares ?

## 2022-03-22 ENCOUNTER — Other Ambulatory Visit: Payer: Self-pay | Admitting: Registered Nurse

## 2022-03-22 DIAGNOSIS — R350 Frequency of micturition: Secondary | ICD-10-CM

## 2022-03-22 DIAGNOSIS — M545 Low back pain, unspecified: Secondary | ICD-10-CM

## 2022-04-02 ENCOUNTER — Encounter: Payer: No Typology Code available for payment source | Admitting: Registered Nurse

## 2022-04-16 ENCOUNTER — Ambulatory Visit (INDEPENDENT_AMBULATORY_CARE_PROVIDER_SITE_OTHER): Payer: No Typology Code available for payment source | Admitting: Family Medicine

## 2022-04-16 DIAGNOSIS — Z0000 Encounter for general adult medical examination without abnormal findings: Secondary | ICD-10-CM

## 2022-04-17 ENCOUNTER — Telehealth (HOSPITAL_BASED_OUTPATIENT_CLINIC_OR_DEPARTMENT_OTHER): Payer: Self-pay

## 2022-04-17 LAB — CBC WITH DIFFERENTIAL/PLATELET
Basophils Absolute: 0 10*3/uL (ref 0.0–0.2)
Basos: 1 %
EOS (ABSOLUTE): 0.2 10*3/uL (ref 0.0–0.4)
Eos: 4 %
Hematocrit: 43.4 % (ref 37.5–51.0)
Hemoglobin: 14.3 g/dL (ref 13.0–17.7)
Immature Grans (Abs): 0 10*3/uL (ref 0.0–0.1)
Immature Granulocytes: 1 %
Lymphocytes Absolute: 2 10*3/uL (ref 0.7–3.1)
Lymphs: 36 %
MCH: 30 pg (ref 26.6–33.0)
MCHC: 32.9 g/dL (ref 31.5–35.7)
MCV: 91 fL (ref 79–97)
Monocytes Absolute: 0.4 10*3/uL (ref 0.1–0.9)
Monocytes: 8 %
Neutrophils Absolute: 2.8 10*3/uL (ref 1.4–7.0)
Neutrophils: 50 %
Platelets: 312 10*3/uL (ref 150–450)
RBC: 4.76 x10E6/uL (ref 4.14–5.80)
RDW: 12.3 % (ref 11.6–15.4)
WBC: 5.5 10*3/uL (ref 3.4–10.8)

## 2022-04-17 LAB — PSA TOTAL (REFLEX TO FREE): Prostate Specific Ag, Serum: 0.7 ng/mL (ref 0.0–4.0)

## 2022-04-17 LAB — LIPID PANEL
Chol/HDL Ratio: 4.9 ratio (ref 0.0–5.0)
Cholesterol, Total: 182 mg/dL (ref 100–199)
HDL: 37 mg/dL — ABNORMAL LOW (ref >39)
LDL Chol Calc (NIH): 114 mg/dL — ABNORMAL HIGH (ref 0–99)
Triglycerides: 173 mg/dL — ABNORMAL HIGH (ref 0–149)
VLDL Cholesterol Cal: 31 mg/dL (ref 5–40)

## 2022-04-17 LAB — COMPREHENSIVE METABOLIC PANEL
ALT: 19 IU/L (ref 0–44)
AST: 17 IU/L (ref 0–40)
Albumin/Globulin Ratio: 1.5 (ref 1.2–2.2)
Albumin: 4.1 g/dL (ref 3.8–4.9)
Alkaline Phosphatase: 61 IU/L (ref 44–121)
BUN/Creatinine Ratio: 19 (ref 9–20)
BUN: 19 mg/dL (ref 6–24)
Bilirubin Total: <0.2 mg/dL (ref 0.0–1.2)
CO2: 23 mmol/L (ref 20–29)
Calcium: 9.1 mg/dL (ref 8.7–10.2)
Chloride: 107 mmol/L — ABNORMAL HIGH (ref 96–106)
Creatinine, Ser: 1.01 mg/dL (ref 0.76–1.27)
Globulin, Total: 2.8 g/dL (ref 1.5–4.5)
Glucose: 106 mg/dL — ABNORMAL HIGH (ref 70–99)
Potassium: 4.7 mmol/L (ref 3.5–5.2)
Sodium: 142 mmol/L (ref 134–144)
Total Protein: 6.9 g/dL (ref 6.0–8.5)
eGFR: 86 mL/min/{1.73_m2} (ref >59)

## 2022-04-17 LAB — TSH RFX ON ABNORMAL TO FREE T4: TSH: 4.47 u[IU]/mL (ref 0.450–4.500)

## 2022-04-17 LAB — HEMOGLOBIN A1C
Est. average glucose Bld gHb Est-mCnc: 111 mg/dL
Hgb A1c MFr Bld: 5.5 % (ref 4.8–5.6)

## 2022-04-17 NOTE — Progress Notes (Signed)
Pt was called it went to voicemail. I left pt a voicemail and told him to give [place] a call back.

## 2022-04-17 NOTE — Telephone Encounter (Signed)
Pt called back. Pt was aware of labs. Pt understood recommendations and dietary changes.  ?

## 2022-04-23 ENCOUNTER — Encounter (HOSPITAL_BASED_OUTPATIENT_CLINIC_OR_DEPARTMENT_OTHER): Payer: No Typology Code available for payment source | Admitting: Family Medicine

## 2022-05-08 IMAGING — CR DG LUMBAR SPINE COMPLETE 4+V
5 series · 5 of 5 positions shown · non-contrast
Comparison: None.

CLINICAL DATA: Subacute lower back pain.

EXAM:
LUMBAR SPINE - COMPLETE 4+ VIEW

[w lumbar spine ap]
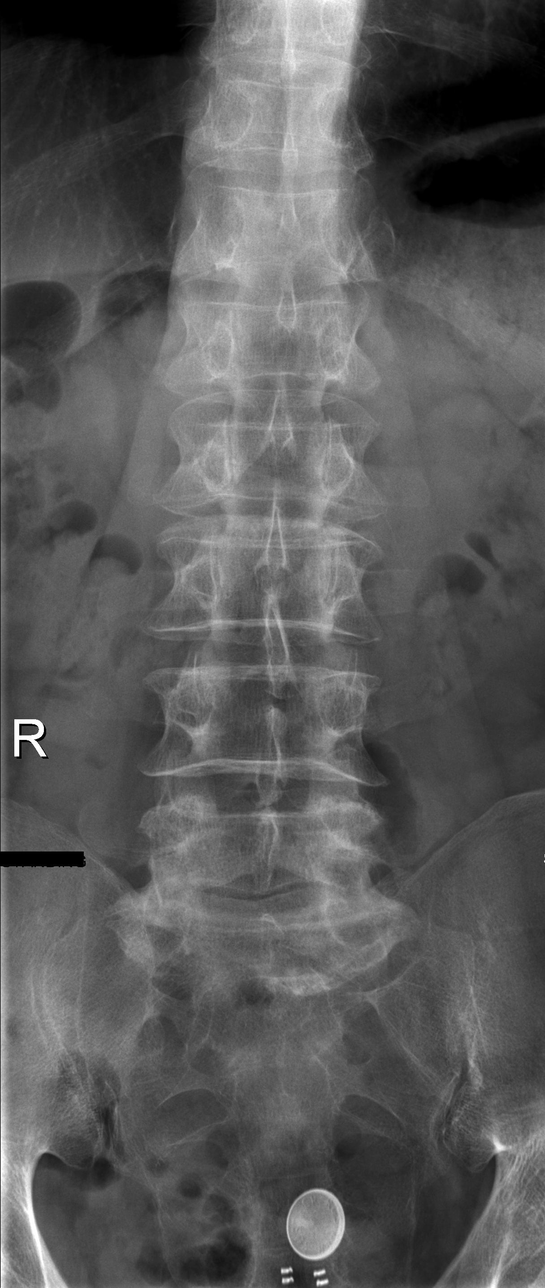

[w lumbar spine obl (1 of 2)]
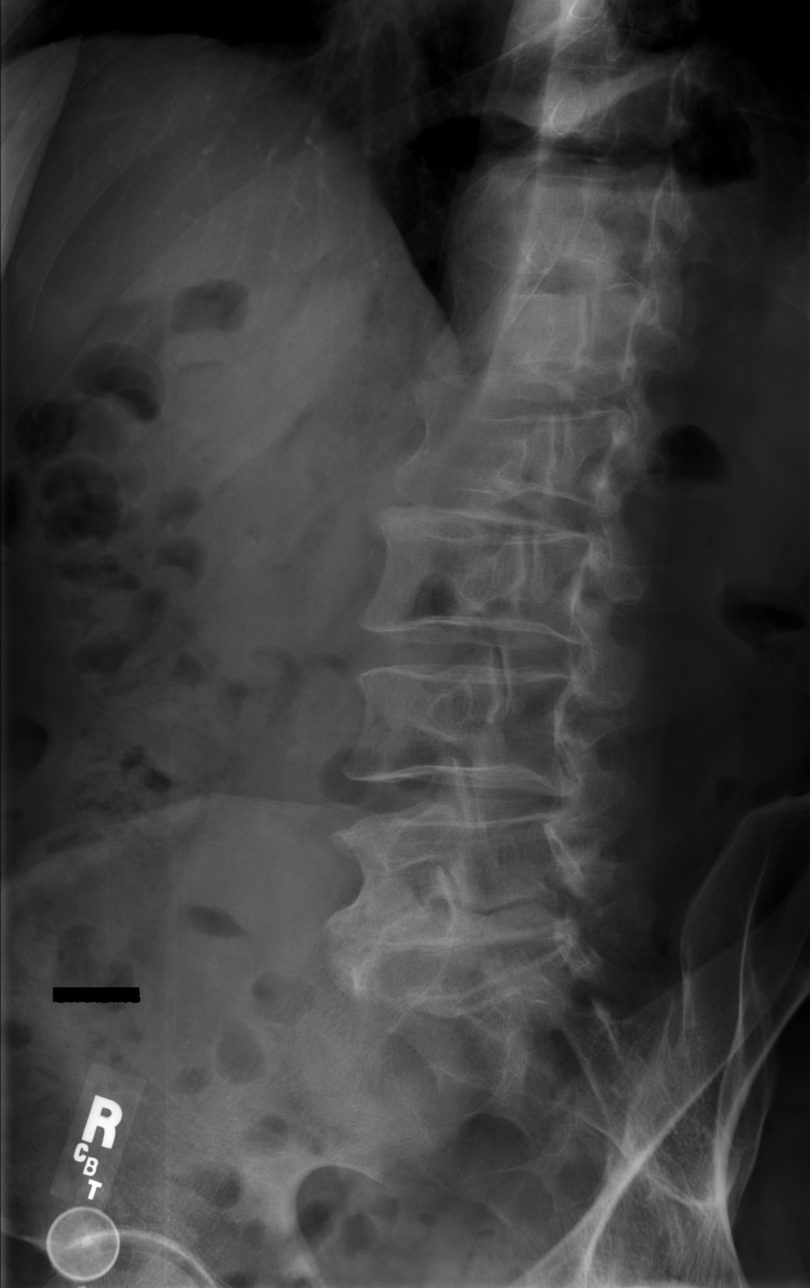

[w lumbar spine obl (2 of 2)]
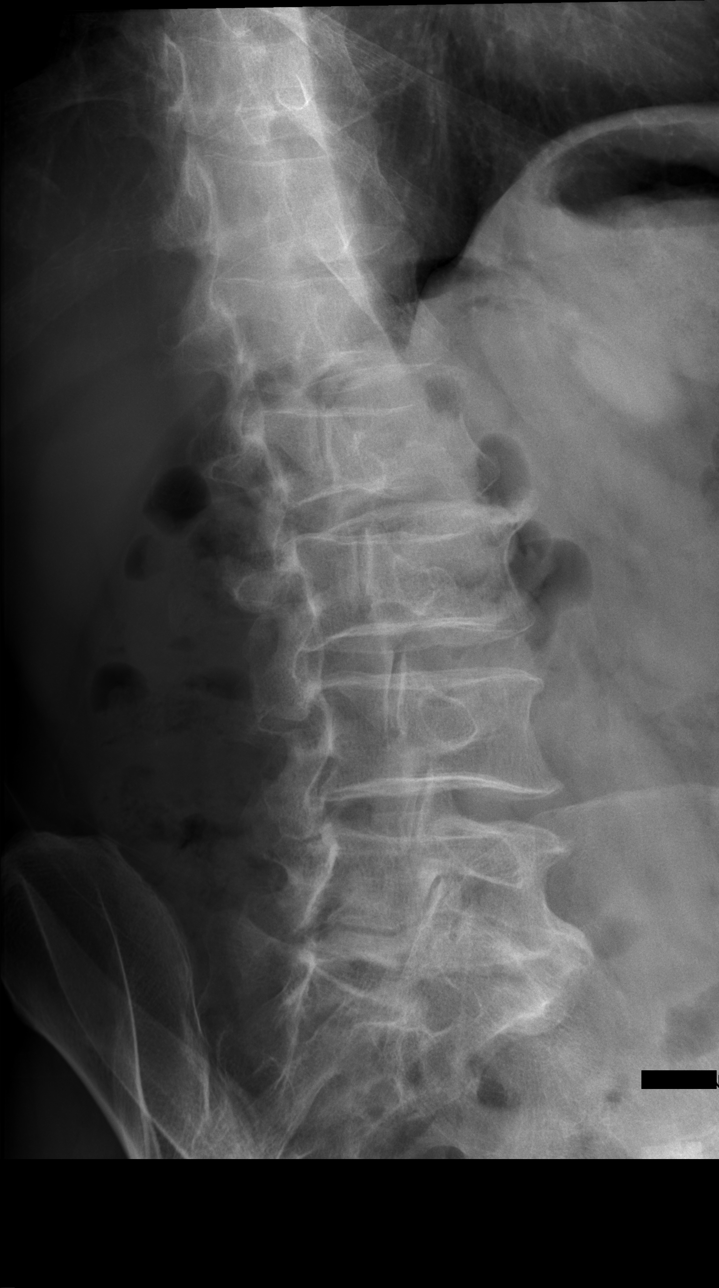

[w lumbar spine lat]
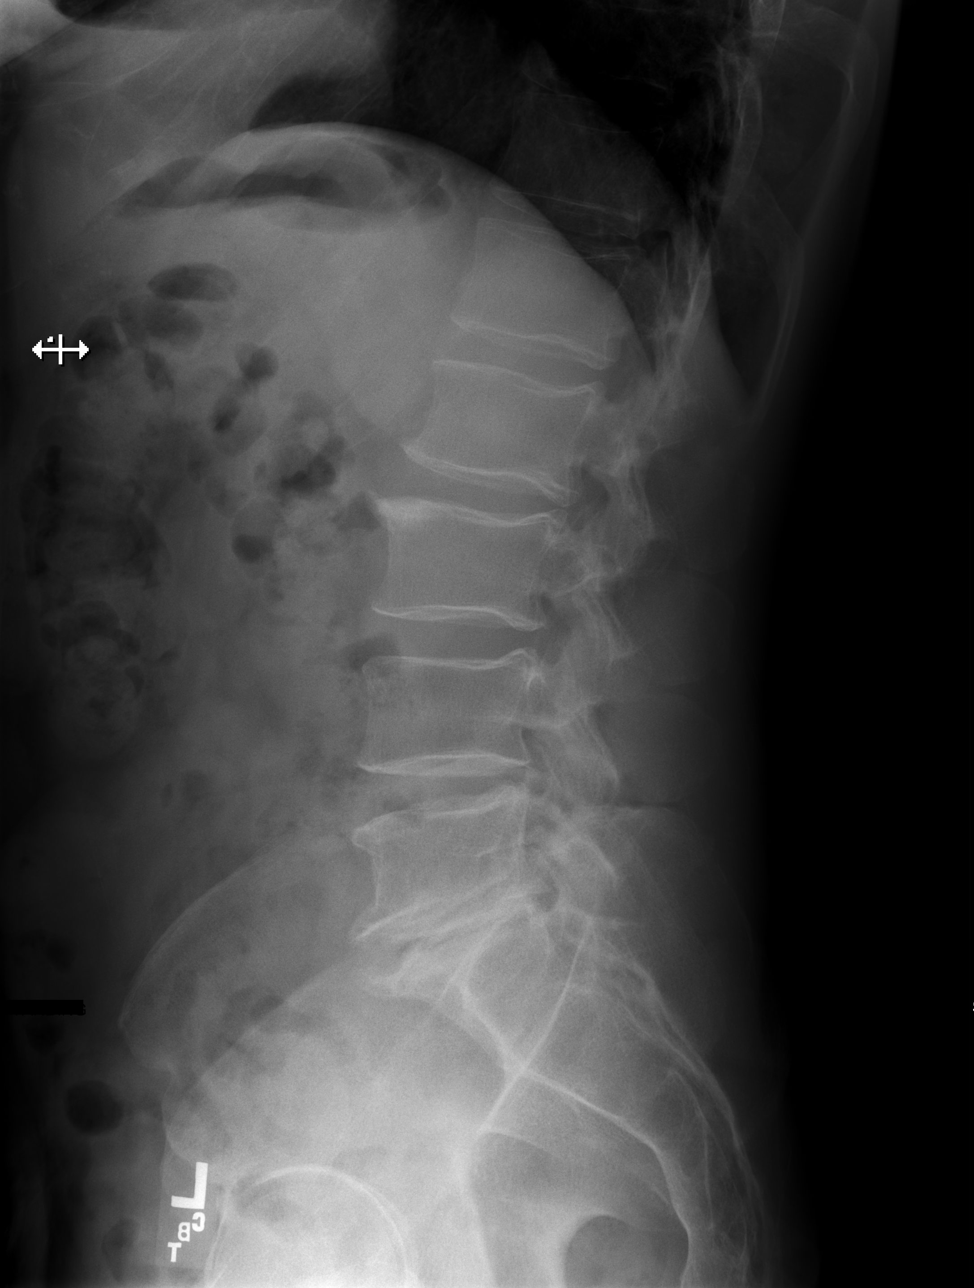

[w lumbar l-5 s-1 spot]
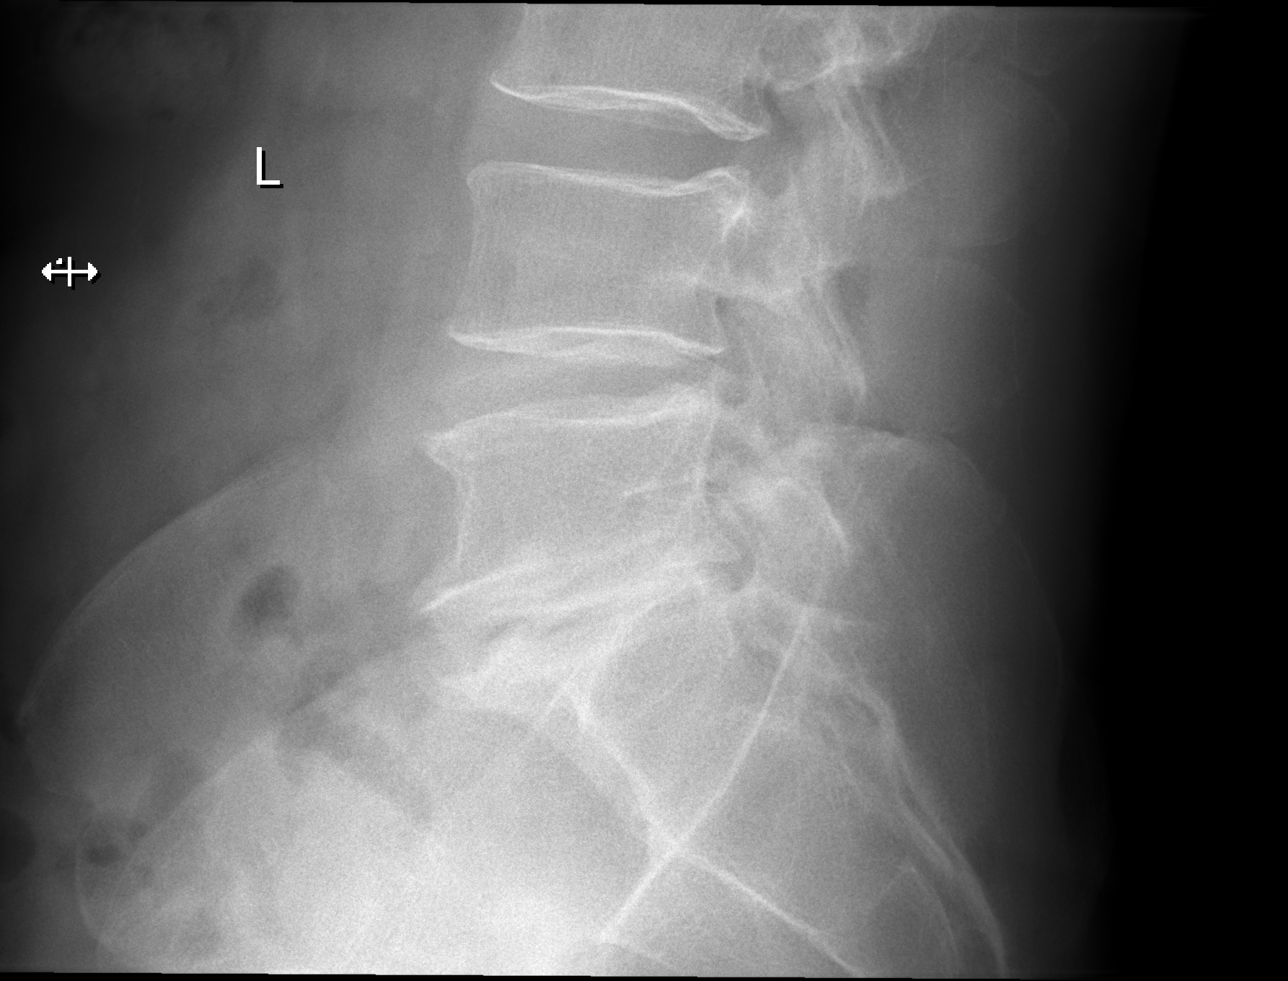

[5 of 5 positions shown; findings below may reference images not displayed]

FINDINGS: There is no evidence for fracture. Alignment is anatomic. There is
moderate disc space narrowing and endplate osteophyte formation at
L5-S1 compatible with degenerative change. Otherwise, disc spaces
are maintained. There are minimal degenerative endplate osteophytes
at L2-L3 and L4-L5. Soft tissues are within normal limits.
IMPRESSION: 1. No acute bony abnormality.
2. Moderate degenerative changes at L5-S1.

## 2022-05-15 ENCOUNTER — Encounter (HOSPITAL_BASED_OUTPATIENT_CLINIC_OR_DEPARTMENT_OTHER): Payer: Self-pay | Admitting: Family Medicine

## 2022-07-09 ENCOUNTER — Other Ambulatory Visit: Payer: Self-pay | Admitting: Family Medicine

## 2022-07-09 ENCOUNTER — Other Ambulatory Visit: Payer: Self-pay | Admitting: Registered Nurse

## 2022-07-09 DIAGNOSIS — R350 Frequency of micturition: Secondary | ICD-10-CM

## 2022-07-31 ENCOUNTER — Encounter (HOSPITAL_BASED_OUTPATIENT_CLINIC_OR_DEPARTMENT_OTHER): Payer: Self-pay | Admitting: Family Medicine

## 2022-07-31 ENCOUNTER — Ambulatory Visit (INDEPENDENT_AMBULATORY_CARE_PROVIDER_SITE_OTHER): Payer: No Typology Code available for payment source | Admitting: Family Medicine

## 2022-07-31 DIAGNOSIS — Z0000 Encounter for general adult medical examination without abnormal findings: Secondary | ICD-10-CM

## 2022-07-31 NOTE — Assessment & Plan Note (Addendum)
Routine HCM labs reviewed. HCM reviewed/discussed. Anticipatory guidance regarding healthy weight, lifestyle and choices given. Recommend healthy diet.  Recommend approximately 150 minutes/week of moderate intensity exercise Recommend regular dental and vision exams Always use seatbelt/lap and shoulder restraints Recommend using smoke alarms and checking batteries at least twice a year Recommend using sunscreen when outside Discussed colon cancer screening recommendations, options.  Patient is UTD Discussed recommendations for shingles vaccine Discussed tetanus immunization recommendations, patient is UTD

## 2022-07-31 NOTE — Progress Notes (Signed)
Subjective:    CC: Annual Physical Exam  HPI:  Siddhanth Denk is a 61 y.o. presenting for annual physical  I reviewed the past medical history, family history, social history, surgical history, and allergies today and no changes were needed.  Please see the problem list section below in epic for further details.  Past Medical History: Past Medical History:  Diagnosis Date   Allergy    SEASONAL   Medical history non-contributory    Past Surgical History: Past Surgical History:  Procedure Laterality Date   ANTERIOR CERVICAL DECOMP/DISCECTOMY FUSION N/A 07/29/2013   Procedure: ANTERIOR CERVICAL DECOMPRESSION/DISCECTOMY FUSION PLATING BONEGRAFT CERVICAL SIX-SEVEN;  Surgeon: Carmela Hurt, MD;  Location: [facility];  Service: Neurosurgery;  Laterality: N/A;   Social History: Social History   Socioeconomic History   Marital status: Married    Spouse name: Not on file   Number of children: Not on file   Years of education: Not on file   Highest education level: Not on file  Occupational History   Not on file  Tobacco Use   Smoking status: Former    Packs/day: 1.00    Years: 20.00    Total pack years: 20.00    Types: Cigarettes    Quit date: 12/17/2005    Years since quitting: 16.6   Smokeless tobacco: Never   Tobacco comments:    Quit  Aug 2007  Substance and Sexual Activity   Alcohol use: Yes    Comment: occ. "during football season"  2-3 drinks on Sunday per pt.    Drug use: No   Sexual activity: Yes    Birth control/protection: None  Other Topics Concern   Not on file  Social History Narrative   Not on file   Social Determinants of Health   Financial Resource Strain: Not on file  Food Insecurity: Not on file  Transportation Needs: Not on file  Physical Activity: Not on file  Stress: Not on file  Social Connections: Not on file   Family History: Family History  Problem Relation Age of Onset   Colon cancer Neg Hx    Allergies: No Known  Allergies Medications: See med rec.  Review of Systems: No headache, visual changes, nausea, vomiting, diarrhea, constipation, dizziness, abdominal pain, skin rash, fevers, chills, night sweats, swollen lymph nodes, weight loss, chest pain, body aches, joint swelling, muscle aches, shortness of breath, mood changes, visual or auditory hallucinations.  Objective:    BP (!) 144/117   Pulse 93   Temp 97.8 F (36.6 C) (Oral)   Ht 6\' 3"  (1.905 m)   Wt 174 lb 9.6 oz (79.2 kg)   SpO2 100%   BMI 21.82 kg/m   General: Well Developed, well nourished, and in no acute distress.  Neuro: Alert and oriented x3, extra-ocular muscles intact, sensation grossly intact. Cranial nerves II through XII are intact, motor, sensory, and coordinative functions are all intact. HEENT: Normocephalic, atraumatic, pupils equal round reactive to light, neck supple, no masses, no lymphadenopathy, thyroid nonpalpable. Oropharynx, nasopharynx, external ear canals are unremarkable.  Impacted cerumen noted in the right auditory canal Skin: Warm and dry, no rashes noted. Cardiac: Regular rate and rhythm, no murmurs rubs or gallops.  Respiratory: Clear to auscultation bilaterally. Not using accessory muscles, speaking in full sentences. Abdominal: Soft, nontender, nondistended, positive bowel sounds, no masses, no organomegaly.  Musculoskeletal: Shoulder, elbow, wrist, hip, knee, ankle stable, and with full range of motion.  Impression and Recommendations:    Wellness examination Routine  HCM labs reviewed. HCM reviewed/discussed. Anticipatory guidance regarding healthy weight, lifestyle and choices given. Recommend healthy diet.  Recommend approximately 150 minutes/week of moderate intensity exercise Recommend regular dental and vision exams Always use seatbelt/lap and shoulder restraints Recommend using smoke alarms and checking batteries at least twice a year Recommend using sunscreen when outside Discussed colon  cancer screening recommendations, options.  Patient is UTD Discussed recommendations for shingles vaccine Discussed tetanus immunization recommendations, patient is UTD  Patient does have impacted cerumen on right side, will proceed with irrigation today  Return in about 1 year (around 08/01/2023) for CPE - 40 min appt.   ___________________________________________ Simi Briel de [place], MD, ABFM, CAQSM Primary Care and Sports Medicine [facility]

## 2022-12-27 ENCOUNTER — Other Ambulatory Visit: Payer: Self-pay | Admitting: Family Medicine

## 2022-12-27 DIAGNOSIS — R350 Frequency of micturition: Secondary | ICD-10-CM

## 2023-01-09 ENCOUNTER — Other Ambulatory Visit (HOSPITAL_BASED_OUTPATIENT_CLINIC_OR_DEPARTMENT_OTHER): Payer: Self-pay

## 2023-01-09 DIAGNOSIS — N529 Male erectile dysfunction, unspecified: Secondary | ICD-10-CM

## 2023-01-09 MED ORDER — TADALAFIL 20 MG PO TABS: 20.0000 mg | ORAL_TABLET | Freq: Every day | ORAL | 1 refills | Status: DC | PRN

## 2023-03-08 ENCOUNTER — Other Ambulatory Visit (HOSPITAL_BASED_OUTPATIENT_CLINIC_OR_DEPARTMENT_OTHER): Payer: Self-pay | Admitting: Family Medicine

## 2023-03-08 DIAGNOSIS — R350 Frequency of micturition: Secondary | ICD-10-CM

## 2023-03-08 DIAGNOSIS — N529 Male erectile dysfunction, unspecified: Secondary | ICD-10-CM

## 2023-06-09 ENCOUNTER — Other Ambulatory Visit (HOSPITAL_BASED_OUTPATIENT_CLINIC_OR_DEPARTMENT_OTHER): Payer: Self-pay | Admitting: Family Medicine

## 2023-06-09 DIAGNOSIS — R350 Frequency of micturition: Secondary | ICD-10-CM

## 2023-08-02 ENCOUNTER — Encounter (HOSPITAL_BASED_OUTPATIENT_CLINIC_OR_DEPARTMENT_OTHER): Payer: No Typology Code available for payment source | Admitting: Family Medicine

## 2024-04-03 ENCOUNTER — Other Ambulatory Visit (HOSPITAL_BASED_OUTPATIENT_CLINIC_OR_DEPARTMENT_OTHER): Payer: Self-pay | Admitting: Family Medicine

## 2024-04-03 DIAGNOSIS — N529 Male erectile dysfunction, unspecified: Secondary | ICD-10-CM

## 2024-04-17 ENCOUNTER — Other Ambulatory Visit (HOSPITAL_BASED_OUTPATIENT_CLINIC_OR_DEPARTMENT_OTHER): Payer: Self-pay | Admitting: Family Medicine

## 2024-04-17 DIAGNOSIS — N529 Male erectile dysfunction, unspecified: Secondary | ICD-10-CM

## 2024-04-17 NOTE — Telephone Encounter (Signed)
 Copied from CRM [id number]. Topic: Clinical - Medication Refill >> Apr 17, 2024  2:44 PM Elle L wrote: Most Recent Primary Care Visit:  Provider: DE Peru, RAYMOND J  Department: [facility]  Visit Type: PHYSICAL 20  Date: 07/31/2022  Medication: tadalafil  (CIALIS ) 20 MG tablet   Has the patient contacted their pharmacy? Yes  Is this the correct pharmacy for this prescription? Yes  This is the patient's preferred pharmacy:  [id number] - [place], [place] - [address] [facility] [address] [place] [address] Phone: [phone] Fax: [phone]   Has the prescription been filled recently? No  Is the patient out of the medication? Yes  Has the patient been seen for an appointment in the last year OR does the patient have an upcoming appointment? Yes  Can we respond through MyChart? No  Agent: Please be advised that Rx refills may take up to 3 business days. We ask that you follow-up with your pharmacy.

## 2024-04-20 NOTE — Telephone Encounter (Signed)
 Last Fill: 03/11/23 18 tabs/9 RF  Last OV: 07/31/22 Next OV: 07/16/24    Routing to provider for review/authorization.

## 2024-04-29 ENCOUNTER — Other Ambulatory Visit (HOSPITAL_BASED_OUTPATIENT_CLINIC_OR_DEPARTMENT_OTHER): Payer: Self-pay | Admitting: *Deleted

## 2024-04-29 ENCOUNTER — Telehealth (HOSPITAL_BASED_OUTPATIENT_CLINIC_OR_DEPARTMENT_OTHER): Payer: Self-pay | Admitting: Family Medicine

## 2024-04-29 DIAGNOSIS — N529 Male erectile dysfunction, unspecified: Secondary | ICD-10-CM

## 2024-04-29 MED ORDER — TADALAFIL 20 MG PO TABS: 20.0000 mg | ORAL_TABLET | Freq: Every day | ORAL | 9 refills | Status: DC | PRN

## 2024-04-29 NOTE — Telephone Encounter (Signed)
 Lvm to let pt know prescription was sent to pharmacy

## 2024-04-29 NOTE — Telephone Encounter (Signed)
 Pt called to ask about refill for tadalafil  20mg  he stated he requested it on may 2nd but has not heard anything back please advise pt

## 2024-04-29 NOTE — Telephone Encounter (Signed)
Please let pt know medication sent to pharmacy.

## 2024-05-04 ENCOUNTER — Other Ambulatory Visit (HOSPITAL_BASED_OUTPATIENT_CLINIC_OR_DEPARTMENT_OTHER): Payer: Self-pay | Admitting: Family Medicine

## 2024-05-04 DIAGNOSIS — N529 Male erectile dysfunction, unspecified: Secondary | ICD-10-CM

## 2024-05-05 ENCOUNTER — Other Ambulatory Visit (HOSPITAL_BASED_OUTPATIENT_CLINIC_OR_DEPARTMENT_OTHER): Payer: Self-pay | Admitting: Family Medicine

## 2024-05-05 DIAGNOSIS — N529 Male erectile dysfunction, unspecified: Secondary | ICD-10-CM

## 2024-05-05 MED ORDER — TADALAFIL 20 MG PO TABS: 10.0000 mg | ORAL_TABLET | Freq: Every day | ORAL | 2 refills | Status: DC | PRN

## 2024-07-16 ENCOUNTER — Ambulatory Visit (INDEPENDENT_AMBULATORY_CARE_PROVIDER_SITE_OTHER): Admitting: Family Medicine

## 2024-07-16 ENCOUNTER — Encounter (HOSPITAL_BASED_OUTPATIENT_CLINIC_OR_DEPARTMENT_OTHER): Payer: Self-pay | Admitting: Family Medicine

## 2024-07-16 VITALS — BP 136/97 | HR 61 | Ht 74.0 in | Wt 166.0 lb

## 2024-07-16 DIAGNOSIS — R350 Frequency of micturition: Secondary | ICD-10-CM

## 2024-07-16 DIAGNOSIS — N401 Enlarged prostate with lower urinary tract symptoms: Secondary | ICD-10-CM | POA: Diagnosis not present

## 2024-07-16 DIAGNOSIS — Z0000 Encounter for general adult medical examination without abnormal findings: Secondary | ICD-10-CM

## 2024-07-16 DIAGNOSIS — Z1211 Encounter for screening for malignant neoplasm of colon: Secondary | ICD-10-CM

## 2024-07-16 DIAGNOSIS — N529 Male erectile dysfunction, unspecified: Secondary | ICD-10-CM

## 2024-07-16 DIAGNOSIS — Z125 Encounter for screening for malignant neoplasm of prostate: Secondary | ICD-10-CM

## 2024-07-16 MED ORDER — TADALAFIL 20 MG PO TABS: 10.0000 mg | ORAL_TABLET | Freq: Every day | ORAL | 2 refills | Status: DC | PRN

## 2024-07-16 NOTE — Progress Notes (Signed)
 Subjective:    CC: Annual Physical Exam  HPI: Ivan Blackwell is a 63 y.o. presenting for annual physical  I reviewed the past medical history, family history, social history, surgical history, and allergies today and no changes were needed.  Please see the problem list section below in epic for further details.  Past Medical History: Past Medical History:  Diagnosis Date   Allergy    SEASONAL   Medical history non-contributory    Past Surgical History: Past Surgical History:  Procedure Laterality Date   ANTERIOR CERVICAL DECOMP/DISCECTOMY FUSION N/A 07/29/2013   Procedure: ANTERIOR CERVICAL DECOMPRESSION/DISCECTOMY FUSION PLATING BONEGRAFT CERVICAL SIX-SEVEN;  Surgeon: Rockey LITTIE Peru, MD;  Location: [facility];  Service: Neurosurgery;  Laterality: N/A;   Social History: Social History   Socioeconomic History   Marital status: Married    Spouse name: Not on file   Number of children: Not on file   Years of education: Not on file   Highest education level: Not on file  Occupational History   Not on file  Tobacco Use   Smoking status: Former    Current packs/day: 0.00    Average packs/day: 1 pack/day for 20.0 years (20.0 ttl pk-yrs)    Types: Cigarettes    Start date: 12/17/1985    Quit date: 12/17/2005    Years since quitting: 18.5   Smokeless tobacco: Never   Tobacco comments:    Quit  Aug 2007  Vaping Use   Vaping status: Never Used  Substance and Sexual Activity   Alcohol use: Yes    Comment: occ. during football season  2-3 drinks on Sunday per pt.    Drug use: No   Sexual activity: Yes    Birth control/protection: None  Other Topics Concern   Not on file  Social History Narrative   Not on file   Social Drivers of Health   Financial Resource Strain: Not on file  Food Insecurity: Not on file  Transportation Needs: Not on file  Physical Activity: Not on file  Stress: Not on file  Social Connections: Not on file   Family History: Family History  Problem  Relation Age of Onset   Colon cancer Neg Hx    Allergies: No Known Allergies Medications: See med rec.  Review of Systems: No headache, visual changes, nausea, vomiting, diarrhea, constipation, dizziness, abdominal pain, skin rash, fevers, chills, night sweats, swollen lymph nodes, weight loss, chest pain, body aches, joint swelling, muscle aches, shortness of breath, mood changes, visual or auditory hallucinations.  Objective:    BP (!) 136/97 (BP Location: Left Arm, Patient Position: Sitting, Cuff Size: Normal)   Pulse 61   Ht 6' 2 (1.88 m)   Wt 166 lb (75.3 kg)   SpO2 100%   BMI 21.31 kg/m   General: Well Developed, well nourished, and in no acute distress.  Neuro: Alert and oriented x3, extra-ocular muscles intact, sensation grossly intact. Cranial nerves II through XII are intact, motor, sensory, and coordinative functions are all intact. HEENT: Normocephalic, atraumatic, pupils equal round reactive to light, neck supple, no masses, no lymphadenopathy, thyroid nonpalpable. Oropharynx, nasopharynx, external ear canals are unremarkable. Skin: Warm and dry, no rashes noted.  Cardiac: Regular rate and rhythm, no murmurs rubs or gallops.  Respiratory: Clear to auscultation bilaterally. Not using accessory muscles, speaking in full sentences.  Abdominal: Soft, nontender, nondistended, positive bowel sounds, no masses, no organomegaly.  Musculoskeletal: Shoulder, elbow, wrist, hip, knee, ankle stable, and with full range of motion.  Impression and Recommendations:    Wellness examination Assessment & Plan: Routine HCM labs ordered. HCM reviewed/discussed. Anticipatory guidance regarding healthy weight, lifestyle and choices given. Recommend healthy diet.  Recommend approximately 150 minutes/week of moderate intensity exercise Recommend regular dental and vision exams Always use seatbelt/lap and shoulder restraints Recommend using smoke alarms and checking batteries at least twice a  year Recommend using sunscreen when outside Discussed colon cancer screening recommendations, options.  Patient is due for recall Discussed recommendations for shingles vaccine Discussed immunization recommendations  Orders: -     CBC with Differential/Platelet; Future -     Comprehensive metabolic panel with GFR; Future -     Hemoglobin A1c; Future -     Lipid panel; Future -     TSH Rfx on Abnormal to Free T4; Future  Special screening for malignant neoplasms, colon -     Ambulatory referral to Gastroenterology  Benign prostatic hyperplasia with urinary frequency -     Ambulatory referral to Urology -     PSA Total (Reflex To Free); Future  Erectile dysfunction, unspecified erectile dysfunction type Assessment & Plan: Provided refill of Cialis  today  Orders: -     Tadalafil ; Take 0.5-1 tablets (10-20 mg total) by mouth daily as needed for erectile dysfunction.  Dispense: 10 tablet; Refill: 2  Prostate cancer screening -     PSA Total (Reflex To Free); Future  Return in about 1 year (around 07/16/2025) for CPE.   ___________________________________________ Chad Tiznado de [place], MD, ABFM, [facility] Primary Care and Sports Medicine [facility]

## 2024-07-16 NOTE — Assessment & Plan Note (Signed)
 Routine HCM labs ordered. HCM reviewed/discussed. Anticipatory guidance regarding healthy weight, lifestyle and choices given. Recommend healthy diet.  Recommend approximately 150 minutes/week of moderate intensity exercise Recommend regular dental and vision exams Always use seatbelt/lap and shoulder restraints Recommend using smoke alarms and checking batteries at least twice a year Recommend using sunscreen when outside Discussed colon cancer screening recommendations, options.  Patient is due for recall Discussed recommendations for shingles vaccine Discussed immunization recommendations

## 2024-07-16 NOTE — Assessment & Plan Note (Signed)
Provided refill of Cialis today ?

## 2024-07-16 NOTE — Patient Instructions (Addendum)
   Medication Instructions:  Your physician recommends that you continue on your current medications as directed. Please refer to the Current Medication list given to you today. --If you need a refill on any your medications before your next appointment, please call your pharmacy first. If no refills are authorized on file call the office.-- Lab Work: Your physician has recommended that you have lab work today: today If you have labs (blood work) drawn today and your tests are completely normal, you will receive your results via MyChart message OR a phone call from our staff.  Please ensure you check your voicemail in the event that you authorized detailed messages to be left on a delegated number. If you have any lab test that is abnormal or we need to change your treatment, we will call you to review the results.    Follow-Up: Your next appointment:   Your physician recommends that you schedule a follow-up appointment in: 1 year physical with Dr. de Peru  You will receive a text message or e-mail with a link to a survey about your care and experience with [place] today! We would greatly appreciate your feedback!   Thanks for letting us be apart of your health journey!!  Primary Care and Sports Medicine   Dr. Ceasar Mons Peru   We encourage you to activate your patient portal called "MyChart".  Sign up information is provided on this After Visit Summary.  MyChart is used to connect with patients for Virtual Visits (Telemedicine).  Patients are able to view lab/test results, encounter notes, upcoming appointments, etc.  Non-urgent messages can be sent to your provider as well. To learn more about what you can do with MyChart, please visit --  [email or web address].

## 2024-07-17 LAB — COMPREHENSIVE METABOLIC PANEL WITH GFR
ALT: 18 IU/L (ref 0–44)
AST: 22 IU/L (ref 0–40)
Albumin: 4.4 g/dL (ref 3.9–4.9)
Alkaline Phosphatase: 51 IU/L (ref 44–121)
BUN/Creatinine Ratio: 20 (ref 10–24)
BUN: 19 mg/dL (ref 8–27)
Bilirubin Total: 0.4 mg/dL (ref 0.0–1.2)
CO2: 20 mmol/L (ref 20–29)
Calcium: 9.1 mg/dL (ref 8.6–10.2)
Chloride: 106 mmol/L (ref 96–106)
Creatinine, Ser: 0.97 mg/dL (ref 0.76–1.27)
Globulin, Total: 2.3 g/dL (ref 1.5–4.5)
Glucose: 80 mg/dL (ref 70–99)
Potassium: 4.5 mmol/L (ref 3.5–5.2)
Sodium: 141 mmol/L (ref 134–144)
Total Protein: 6.7 g/dL (ref 6.0–8.5)
eGFR: 89 mL/min/1.73 (ref >59)

## 2024-07-17 LAB — CBC WITH DIFFERENTIAL/PLATELET
Basophils Absolute: 0 x10E3/uL (ref 0.0–0.2)
Basos: 1 %
EOS (ABSOLUTE): 0.2 x10E3/uL (ref 0.0–0.4)
Eos: 3 %
Hematocrit: 41.1 % (ref 37.5–51.0)
Hemoglobin: 13.4 g/dL (ref 13.0–17.7)
Immature Grans (Abs): 0 x10E3/uL (ref 0.0–0.1)
Immature Granulocytes: 0 %
Lymphocytes Absolute: 2 x10E3/uL (ref 0.7–3.1)
Lymphs: 38 %
MCH: 30.8 pg (ref 26.6–33.0)
MCHC: 32.6 g/dL (ref 31.5–35.7)
MCV: 95 fL (ref 79–97)
Monocytes Absolute: 0.4 x10E3/uL (ref 0.1–0.9)
Monocytes: 8 %
Neutrophils Absolute: 2.6 x10E3/uL (ref 1.4–7.0)
Neutrophils: 50 %
Platelets: 231 x10E3/uL (ref 150–450)
RBC: 4.35 x10E6/uL (ref 4.14–5.80)
RDW: 13 % (ref 11.6–15.4)
WBC: 5.2 x10E3/uL (ref 3.4–10.8)

## 2024-07-17 LAB — LIPID PANEL
Chol/HDL Ratio: 3 ratio (ref 0.0–5.0)
Cholesterol, Total: 157 mg/dL (ref 100–199)
HDL: 52 mg/dL (ref >39)
LDL Chol Calc (NIH): 88 mg/dL (ref 0–99)
Triglycerides: 89 mg/dL (ref 0–149)
VLDL Cholesterol Cal: 17 mg/dL (ref 5–40)

## 2024-07-17 LAB — PSA TOTAL (REFLEX TO FREE): Prostate Specific Ag, Serum: 0.6 ng/mL (ref 0.0–4.0)

## 2024-07-17 LAB — HEMOGLOBIN A1C
Est. average glucose Bld gHb Est-mCnc: 103 mg/dL
Hgb A1c MFr Bld: 5.2 % (ref 4.8–5.6)

## 2024-07-17 LAB — TSH RFX ON ABNORMAL TO FREE T4: TSH: 1.54 u[IU]/mL (ref 0.450–4.500)

## 2024-07-18 ENCOUNTER — Encounter: Payer: Self-pay | Admitting: Internal Medicine

## 2024-08-05 ENCOUNTER — Encounter: Payer: Self-pay | Admitting: Internal Medicine

## 2024-08-05 ENCOUNTER — Ambulatory Visit (AMBULATORY_SURGERY_CENTER)

## 2024-08-05 VITALS — Ht 74.0 in | Wt 170.0 lb

## 2024-08-05 DIAGNOSIS — Z1211 Encounter for screening for malignant neoplasm of colon: Secondary | ICD-10-CM

## 2024-08-05 MED ORDER — NA SULFATE-K SULFATE-MG SULF 17.5-3.13-1.6 GM/177ML PO SOLN: 1.0000 | Freq: Once | ORAL | 0 refills | Status: AC

## 2024-08-05 NOTE — Progress Notes (Signed)
 No egg or soy allergy known to patient  No issues known to pt with past sedation with any surgeries or procedures Patient denies ever being told they had issues or difficulty with intubation  No FH of Malignant Hyperthermia Pt is not on diet pills Pt is not on  home 02  Pt is not on blood thinners  Pt denies issues with constipation  No A fib or A flutter Have any cardiac testing pending-- no  LOA: independent  Prep: suprep   Patient's chart reviewed by Norleen Schillings CNRA prior to previsit and patient appropriate for the [facility].  Previsit completed and red dot placed by patient's name on their procedure day (on provider's schedule).     PV completed with patient. Prep instructions sent to home address.

## 2024-08-13 ENCOUNTER — Telehealth (HOSPITAL_BASED_OUTPATIENT_CLINIC_OR_DEPARTMENT_OTHER): Payer: Self-pay | Admitting: *Deleted

## 2024-08-13 ENCOUNTER — Telehealth (HOSPITAL_BASED_OUTPATIENT_CLINIC_OR_DEPARTMENT_OTHER): Payer: Self-pay | Admitting: Family Medicine

## 2024-08-13 ENCOUNTER — Other Ambulatory Visit (HOSPITAL_BASED_OUTPATIENT_CLINIC_OR_DEPARTMENT_OTHER): Payer: Self-pay | Admitting: *Deleted

## 2024-08-13 DIAGNOSIS — Z1211 Encounter for screening for malignant neoplasm of colon: Secondary | ICD-10-CM

## 2024-08-13 NOTE — Telephone Encounter (Signed)
 error

## 2024-08-13 NOTE — Telephone Encounter (Signed)
 Copied from CRM [id number]. Topic: Clinical - Medical Advice >> Aug 13, 2024 10:32 AM Cleave MATSU wrote: Reason for CRM: pt said he have to come in for a colonopsy and his friend had one done but he got his done by them sampling his stool he said can they do his like that as well because the last time he had one done they stuck something in his behind.please follow up with pt to let him know.

## 2024-08-13 NOTE — Telephone Encounter (Signed)
 Called patient. Asked if patient still had colonoscopy with gastro scheduled he stated he was not answering questions like that. He wanted to know could he submit a sample like his friend did. I asked patient did he have any history of colon cancer in his family he stated his family died early he did not know. Asked patient if he had any history of blood in his stool as this factors in on whether cologuard kit would be appropriate. Patient begins yelling at me stating I was not answering his question. I waited until patient was done yelling and tried to proceed with letting him know that the sample kit would be sent to his home. Again he started yelling that I was not answering his question as to whether he could just leave a sample. I asked if he would stop yelling at me I could proceed to let him know how to submit this sample. He stated he was not f*cking yelling and if he was yelling I would know it. I started again to give him directions on the cologuard kit an patient started yelling again that I was not answering his question if he could submit a sample and he would like to speak with supervisor. Please call patient back.

## 2024-08-13 NOTE — Telephone Encounter (Signed)
 Spoke with patient. He cancelled the colonscopy and wants to have a cologuard. Please put in a referral for the patient and send to his home.

## 2024-08-13 NOTE — Telephone Encounter (Signed)
 Cologuard ordered for patient.

## 2024-08-19 ENCOUNTER — Encounter: Admitting: Internal Medicine

## 2024-08-29 LAB — COLOGUARD: COLOGUARD: NEGATIVE

## 2024-09-02 ENCOUNTER — Telehealth (HOSPITAL_BASED_OUTPATIENT_CLINIC_OR_DEPARTMENT_OTHER): Payer: Self-pay | Admitting: *Deleted

## 2024-09-02 NOTE — Telephone Encounter (Signed)
 Copied from CRM [id number]. Topic: Clinical - Lab/Test Results >> Sep 02, 2024  1:26 PM Sophia H wrote: Reason for CRM: Patient is following up on cologuard results. Looks like final result in but no note from provider. Patient states can call or text. Patient disconnected line.

## 2024-09-02 NOTE — Telephone Encounter (Signed)
 Patient advised cologuard results were negative with verbal understanding

## 2024-09-03 ENCOUNTER — Ambulatory Visit (HOSPITAL_BASED_OUTPATIENT_CLINIC_OR_DEPARTMENT_OTHER): Payer: Self-pay | Admitting: Family Medicine

## 2024-09-15 ENCOUNTER — Ambulatory Visit (HOSPITAL_BASED_OUTPATIENT_CLINIC_OR_DEPARTMENT_OTHER): Payer: Self-pay | Admitting: Family Medicine

## 2024-10-14 ENCOUNTER — Other Ambulatory Visit (HOSPITAL_BASED_OUTPATIENT_CLINIC_OR_DEPARTMENT_OTHER): Payer: Self-pay | Admitting: Family Medicine

## 2024-10-14 DIAGNOSIS — N529 Male erectile dysfunction, unspecified: Secondary | ICD-10-CM

## 2024-11-24 ENCOUNTER — Telehealth: Payer: Self-pay

## 2024-11-24 NOTE — Telephone Encounter (Signed)
 RN called patient to schedule colonoscopy-recall, no answer.  Voicemail left to call [facility] at his earliest convenience to reschedule colonoscopy.

## 2025-07-19 ENCOUNTER — Encounter (HOSPITAL_BASED_OUTPATIENT_CLINIC_OR_DEPARTMENT_OTHER): Admitting: Family Medicine
